# Patient Record
Sex: Female | Born: 1945 | Hispanic: No | Marital: Married | Smoking: Never smoker
Health system: Southern US, Community
[De-identification: ages and names within clinical notes are randomized; demographics above are authoritative.]

## PROBLEM LIST (undated history)

## (undated) DIAGNOSIS — I10 Essential (primary) hypertension: Secondary | ICD-10-CM

## (undated) HISTORY — PX: CHOLECYSTECTOMY: SHX55

---

## 2014-08-10 DIAGNOSIS — J301 Allergic rhinitis due to pollen: Secondary | ICD-10-CM | POA: Diagnosis not present

## 2014-08-10 DIAGNOSIS — J3089 Other allergic rhinitis: Secondary | ICD-10-CM | POA: Diagnosis not present

## 2014-08-10 DIAGNOSIS — S56911D Strain of unspecified muscles, fascia and tendons at forearm level, right arm, subsequent encounter: Secondary | ICD-10-CM | POA: Diagnosis not present

## 2014-08-10 DIAGNOSIS — J3081 Allergic rhinitis due to animal (cat) (dog) hair and dander: Secondary | ICD-10-CM | POA: Diagnosis not present

## 2014-08-17 DIAGNOSIS — J3089 Other allergic rhinitis: Secondary | ICD-10-CM | POA: Diagnosis not present

## 2014-08-17 DIAGNOSIS — J3081 Allergic rhinitis due to animal (cat) (dog) hair and dander: Secondary | ICD-10-CM | POA: Diagnosis not present

## 2014-08-17 DIAGNOSIS — J301 Allergic rhinitis due to pollen: Secondary | ICD-10-CM | POA: Diagnosis not present

## 2014-08-24 DIAGNOSIS — J301 Allergic rhinitis due to pollen: Secondary | ICD-10-CM | POA: Diagnosis not present

## 2014-08-24 DIAGNOSIS — J3089 Other allergic rhinitis: Secondary | ICD-10-CM | POA: Diagnosis not present

## 2014-08-24 DIAGNOSIS — J3081 Allergic rhinitis due to animal (cat) (dog) hair and dander: Secondary | ICD-10-CM | POA: Diagnosis not present

## 2014-08-31 DIAGNOSIS — J301 Allergic rhinitis due to pollen: Secondary | ICD-10-CM | POA: Diagnosis not present

## 2014-08-31 DIAGNOSIS — J3081 Allergic rhinitis due to animal (cat) (dog) hair and dander: Secondary | ICD-10-CM | POA: Diagnosis not present

## 2014-08-31 DIAGNOSIS — J3089 Other allergic rhinitis: Secondary | ICD-10-CM | POA: Diagnosis not present

## 2014-09-07 DIAGNOSIS — J3081 Allergic rhinitis due to animal (cat) (dog) hair and dander: Secondary | ICD-10-CM | POA: Diagnosis not present

## 2014-09-07 DIAGNOSIS — J301 Allergic rhinitis due to pollen: Secondary | ICD-10-CM | POA: Diagnosis not present

## 2014-09-07 DIAGNOSIS — J3089 Other allergic rhinitis: Secondary | ICD-10-CM | POA: Diagnosis not present

## 2014-09-07 DIAGNOSIS — S56911D Strain of unspecified muscles, fascia and tendons at forearm level, right arm, subsequent encounter: Secondary | ICD-10-CM | POA: Diagnosis not present

## 2014-09-15 DIAGNOSIS — J301 Allergic rhinitis due to pollen: Secondary | ICD-10-CM | POA: Diagnosis not present

## 2014-09-15 DIAGNOSIS — J3081 Allergic rhinitis due to animal (cat) (dog) hair and dander: Secondary | ICD-10-CM | POA: Diagnosis not present

## 2014-09-15 DIAGNOSIS — J3089 Other allergic rhinitis: Secondary | ICD-10-CM | POA: Diagnosis not present

## 2014-09-21 DIAGNOSIS — J301 Allergic rhinitis due to pollen: Secondary | ICD-10-CM | POA: Diagnosis not present

## 2014-09-21 DIAGNOSIS — J3089 Other allergic rhinitis: Secondary | ICD-10-CM | POA: Diagnosis not present

## 2014-09-21 DIAGNOSIS — J3081 Allergic rhinitis due to animal (cat) (dog) hair and dander: Secondary | ICD-10-CM | POA: Diagnosis not present

## 2014-09-28 DIAGNOSIS — J301 Allergic rhinitis due to pollen: Secondary | ICD-10-CM | POA: Diagnosis not present

## 2014-09-28 DIAGNOSIS — J3089 Other allergic rhinitis: Secondary | ICD-10-CM | POA: Diagnosis not present

## 2014-09-28 DIAGNOSIS — J3081 Allergic rhinitis due to animal (cat) (dog) hair and dander: Secondary | ICD-10-CM | POA: Diagnosis not present

## 2014-10-05 DIAGNOSIS — J3089 Other allergic rhinitis: Secondary | ICD-10-CM | POA: Diagnosis not present

## 2014-10-05 DIAGNOSIS — J3081 Allergic rhinitis due to animal (cat) (dog) hair and dander: Secondary | ICD-10-CM | POA: Diagnosis not present

## 2014-10-05 DIAGNOSIS — J301 Allergic rhinitis due to pollen: Secondary | ICD-10-CM | POA: Diagnosis not present

## 2014-10-12 DIAGNOSIS — J3089 Other allergic rhinitis: Secondary | ICD-10-CM | POA: Diagnosis not present

## 2014-10-12 DIAGNOSIS — J3081 Allergic rhinitis due to animal (cat) (dog) hair and dander: Secondary | ICD-10-CM | POA: Diagnosis not present

## 2014-10-12 DIAGNOSIS — J301 Allergic rhinitis due to pollen: Secondary | ICD-10-CM | POA: Diagnosis not present

## 2014-10-20 DIAGNOSIS — J301 Allergic rhinitis due to pollen: Secondary | ICD-10-CM | POA: Diagnosis not present

## 2014-10-20 DIAGNOSIS — J3081 Allergic rhinitis due to animal (cat) (dog) hair and dander: Secondary | ICD-10-CM | POA: Diagnosis not present

## 2014-10-20 DIAGNOSIS — J3089 Other allergic rhinitis: Secondary | ICD-10-CM | POA: Diagnosis not present

## 2014-10-26 DIAGNOSIS — J301 Allergic rhinitis due to pollen: Secondary | ICD-10-CM | POA: Diagnosis not present

## 2014-10-26 DIAGNOSIS — J3089 Other allergic rhinitis: Secondary | ICD-10-CM | POA: Diagnosis not present

## 2014-10-26 DIAGNOSIS — J3081 Allergic rhinitis due to animal (cat) (dog) hair and dander: Secondary | ICD-10-CM | POA: Diagnosis not present

## 2014-11-02 DIAGNOSIS — J3081 Allergic rhinitis due to animal (cat) (dog) hair and dander: Secondary | ICD-10-CM | POA: Diagnosis not present

## 2014-11-02 DIAGNOSIS — J301 Allergic rhinitis due to pollen: Secondary | ICD-10-CM | POA: Diagnosis not present

## 2014-11-02 DIAGNOSIS — J3089 Other allergic rhinitis: Secondary | ICD-10-CM | POA: Diagnosis not present

## 2014-11-09 DIAGNOSIS — J3081 Allergic rhinitis due to animal (cat) (dog) hair and dander: Secondary | ICD-10-CM | POA: Diagnosis not present

## 2014-11-09 DIAGNOSIS — J3089 Other allergic rhinitis: Secondary | ICD-10-CM | POA: Diagnosis not present

## 2014-11-09 DIAGNOSIS — J301 Allergic rhinitis due to pollen: Secondary | ICD-10-CM | POA: Diagnosis not present

## 2014-11-16 DIAGNOSIS — J3081 Allergic rhinitis due to animal (cat) (dog) hair and dander: Secondary | ICD-10-CM | POA: Diagnosis not present

## 2014-11-16 DIAGNOSIS — J301 Allergic rhinitis due to pollen: Secondary | ICD-10-CM | POA: Diagnosis not present

## 2014-11-16 DIAGNOSIS — J3089 Other allergic rhinitis: Secondary | ICD-10-CM | POA: Diagnosis not present

## 2014-11-22 DIAGNOSIS — J3089 Other allergic rhinitis: Secondary | ICD-10-CM | POA: Diagnosis not present

## 2014-12-08 DIAGNOSIS — J301 Allergic rhinitis due to pollen: Secondary | ICD-10-CM | POA: Diagnosis not present

## 2014-12-08 DIAGNOSIS — J3081 Allergic rhinitis due to animal (cat) (dog) hair and dander: Secondary | ICD-10-CM | POA: Diagnosis not present

## 2014-12-08 DIAGNOSIS — J3089 Other allergic rhinitis: Secondary | ICD-10-CM | POA: Diagnosis not present

## 2014-12-14 DIAGNOSIS — J3089 Other allergic rhinitis: Secondary | ICD-10-CM | POA: Diagnosis not present

## 2014-12-14 DIAGNOSIS — J301 Allergic rhinitis due to pollen: Secondary | ICD-10-CM | POA: Diagnosis not present

## 2014-12-29 DIAGNOSIS — J3081 Allergic rhinitis due to animal (cat) (dog) hair and dander: Secondary | ICD-10-CM | POA: Diagnosis not present

## 2014-12-29 DIAGNOSIS — J3089 Other allergic rhinitis: Secondary | ICD-10-CM | POA: Diagnosis not present

## 2014-12-29 DIAGNOSIS — J301 Allergic rhinitis due to pollen: Secondary | ICD-10-CM | POA: Diagnosis not present

## 2015-01-05 DIAGNOSIS — J3089 Other allergic rhinitis: Secondary | ICD-10-CM | POA: Diagnosis not present

## 2015-01-05 DIAGNOSIS — J3081 Allergic rhinitis due to animal (cat) (dog) hair and dander: Secondary | ICD-10-CM | POA: Diagnosis not present

## 2015-01-05 DIAGNOSIS — J301 Allergic rhinitis due to pollen: Secondary | ICD-10-CM | POA: Diagnosis not present

## 2015-01-08 DIAGNOSIS — J301 Allergic rhinitis due to pollen: Secondary | ICD-10-CM | POA: Diagnosis not present

## 2015-01-08 DIAGNOSIS — J3081 Allergic rhinitis due to animal (cat) (dog) hair and dander: Secondary | ICD-10-CM | POA: Diagnosis not present

## 2015-01-11 DIAGNOSIS — J3081 Allergic rhinitis due to animal (cat) (dog) hair and dander: Secondary | ICD-10-CM | POA: Diagnosis not present

## 2015-01-11 DIAGNOSIS — J301 Allergic rhinitis due to pollen: Secondary | ICD-10-CM | POA: Diagnosis not present

## 2015-01-11 DIAGNOSIS — J3089 Other allergic rhinitis: Secondary | ICD-10-CM | POA: Diagnosis not present

## 2015-01-18 DIAGNOSIS — J3089 Other allergic rhinitis: Secondary | ICD-10-CM | POA: Diagnosis not present

## 2015-01-18 DIAGNOSIS — J301 Allergic rhinitis due to pollen: Secondary | ICD-10-CM | POA: Diagnosis not present

## 2015-01-25 DIAGNOSIS — E559 Vitamin D deficiency, unspecified: Secondary | ICD-10-CM | POA: Diagnosis not present

## 2015-01-25 DIAGNOSIS — J3089 Other allergic rhinitis: Secondary | ICD-10-CM | POA: Diagnosis not present

## 2015-01-25 DIAGNOSIS — I10 Essential (primary) hypertension: Secondary | ICD-10-CM | POA: Diagnosis not present

## 2015-01-25 DIAGNOSIS — E2839 Other primary ovarian failure: Secondary | ICD-10-CM | POA: Diagnosis not present

## 2015-01-25 DIAGNOSIS — E780 Pure hypercholesterolemia: Secondary | ICD-10-CM | POA: Diagnosis not present

## 2015-01-25 DIAGNOSIS — J301 Allergic rhinitis due to pollen: Secondary | ICD-10-CM | POA: Diagnosis not present

## 2015-01-25 DIAGNOSIS — E1122 Type 2 diabetes mellitus with diabetic chronic kidney disease: Secondary | ICD-10-CM | POA: Diagnosis not present

## 2015-01-25 DIAGNOSIS — N182 Chronic kidney disease, stage 2 (mild): Secondary | ICD-10-CM | POA: Diagnosis not present

## 2015-01-25 DIAGNOSIS — R922 Inconclusive mammogram: Secondary | ICD-10-CM | POA: Diagnosis not present

## 2015-01-26 ENCOUNTER — Other Ambulatory Visit: Payer: Self-pay | Admitting: Family Medicine

## 2015-01-26 DIAGNOSIS — R922 Inconclusive mammogram: Secondary | ICD-10-CM

## 2015-02-01 DIAGNOSIS — J301 Allergic rhinitis due to pollen: Secondary | ICD-10-CM | POA: Diagnosis not present

## 2015-02-01 DIAGNOSIS — J3089 Other allergic rhinitis: Secondary | ICD-10-CM | POA: Diagnosis not present

## 2015-02-08 DIAGNOSIS — J301 Allergic rhinitis due to pollen: Secondary | ICD-10-CM | POA: Diagnosis not present

## 2015-02-08 DIAGNOSIS — J3081 Allergic rhinitis due to animal (cat) (dog) hair and dander: Secondary | ICD-10-CM | POA: Diagnosis not present

## 2015-02-08 DIAGNOSIS — J3089 Other allergic rhinitis: Secondary | ICD-10-CM | POA: Diagnosis not present

## 2015-02-15 DIAGNOSIS — J301 Allergic rhinitis due to pollen: Secondary | ICD-10-CM | POA: Diagnosis not present

## 2015-02-15 DIAGNOSIS — J3089 Other allergic rhinitis: Secondary | ICD-10-CM | POA: Diagnosis not present

## 2015-02-22 DIAGNOSIS — J301 Allergic rhinitis due to pollen: Secondary | ICD-10-CM | POA: Diagnosis not present

## 2015-02-22 DIAGNOSIS — J3089 Other allergic rhinitis: Secondary | ICD-10-CM | POA: Diagnosis not present

## 2015-03-01 DIAGNOSIS — J3089 Other allergic rhinitis: Secondary | ICD-10-CM | POA: Diagnosis not present

## 2015-03-01 DIAGNOSIS — J301 Allergic rhinitis due to pollen: Secondary | ICD-10-CM | POA: Diagnosis not present

## 2015-03-08 DIAGNOSIS — J3089 Other allergic rhinitis: Secondary | ICD-10-CM | POA: Diagnosis not present

## 2015-03-08 DIAGNOSIS — J301 Allergic rhinitis due to pollen: Secondary | ICD-10-CM | POA: Diagnosis not present

## 2015-03-08 DIAGNOSIS — J3081 Allergic rhinitis due to animal (cat) (dog) hair and dander: Secondary | ICD-10-CM | POA: Diagnosis not present

## 2015-03-15 DIAGNOSIS — J3081 Allergic rhinitis due to animal (cat) (dog) hair and dander: Secondary | ICD-10-CM | POA: Diagnosis not present

## 2015-03-15 DIAGNOSIS — J301 Allergic rhinitis due to pollen: Secondary | ICD-10-CM | POA: Diagnosis not present

## 2015-03-15 DIAGNOSIS — J3089 Other allergic rhinitis: Secondary | ICD-10-CM | POA: Diagnosis not present

## 2015-03-22 DIAGNOSIS — J3089 Other allergic rhinitis: Secondary | ICD-10-CM | POA: Diagnosis not present

## 2015-03-22 DIAGNOSIS — J301 Allergic rhinitis due to pollen: Secondary | ICD-10-CM | POA: Diagnosis not present

## 2015-03-29 DIAGNOSIS — J3089 Other allergic rhinitis: Secondary | ICD-10-CM | POA: Diagnosis not present

## 2015-03-29 DIAGNOSIS — J3081 Allergic rhinitis due to animal (cat) (dog) hair and dander: Secondary | ICD-10-CM | POA: Diagnosis not present

## 2015-03-29 DIAGNOSIS — J301 Allergic rhinitis due to pollen: Secondary | ICD-10-CM | POA: Diagnosis not present

## 2015-04-05 DIAGNOSIS — J3081 Allergic rhinitis due to animal (cat) (dog) hair and dander: Secondary | ICD-10-CM | POA: Diagnosis not present

## 2015-04-05 DIAGNOSIS — J3089 Other allergic rhinitis: Secondary | ICD-10-CM | POA: Diagnosis not present

## 2015-04-05 DIAGNOSIS — J301 Allergic rhinitis due to pollen: Secondary | ICD-10-CM | POA: Diagnosis not present

## 2015-04-12 DIAGNOSIS — J301 Allergic rhinitis due to pollen: Secondary | ICD-10-CM | POA: Diagnosis not present

## 2015-04-12 DIAGNOSIS — J3081 Allergic rhinitis due to animal (cat) (dog) hair and dander: Secondary | ICD-10-CM | POA: Diagnosis not present

## 2015-04-12 DIAGNOSIS — J3089 Other allergic rhinitis: Secondary | ICD-10-CM | POA: Diagnosis not present

## 2015-04-19 DIAGNOSIS — J3089 Other allergic rhinitis: Secondary | ICD-10-CM | POA: Diagnosis not present

## 2015-04-19 DIAGNOSIS — J3081 Allergic rhinitis due to animal (cat) (dog) hair and dander: Secondary | ICD-10-CM | POA: Diagnosis not present

## 2015-04-19 DIAGNOSIS — J301 Allergic rhinitis due to pollen: Secondary | ICD-10-CM | POA: Diagnosis not present

## 2015-04-27 DIAGNOSIS — J301 Allergic rhinitis due to pollen: Secondary | ICD-10-CM | POA: Diagnosis not present

## 2015-04-27 DIAGNOSIS — J3089 Other allergic rhinitis: Secondary | ICD-10-CM | POA: Diagnosis not present

## 2015-04-27 DIAGNOSIS — J3081 Allergic rhinitis due to animal (cat) (dog) hair and dander: Secondary | ICD-10-CM | POA: Diagnosis not present

## 2015-05-03 DIAGNOSIS — J301 Allergic rhinitis due to pollen: Secondary | ICD-10-CM | POA: Diagnosis not present

## 2015-05-03 DIAGNOSIS — J3089 Other allergic rhinitis: Secondary | ICD-10-CM | POA: Diagnosis not present

## 2015-05-03 DIAGNOSIS — J3081 Allergic rhinitis due to animal (cat) (dog) hair and dander: Secondary | ICD-10-CM | POA: Diagnosis not present

## 2015-05-10 DIAGNOSIS — J3089 Other allergic rhinitis: Secondary | ICD-10-CM | POA: Diagnosis not present

## 2015-05-10 DIAGNOSIS — J3081 Allergic rhinitis due to animal (cat) (dog) hair and dander: Secondary | ICD-10-CM | POA: Diagnosis not present

## 2015-05-10 DIAGNOSIS — J301 Allergic rhinitis due to pollen: Secondary | ICD-10-CM | POA: Diagnosis not present

## 2015-05-18 DIAGNOSIS — J301 Allergic rhinitis due to pollen: Secondary | ICD-10-CM | POA: Diagnosis not present

## 2015-05-18 DIAGNOSIS — J3081 Allergic rhinitis due to animal (cat) (dog) hair and dander: Secondary | ICD-10-CM | POA: Diagnosis not present

## 2015-05-18 DIAGNOSIS — J3089 Other allergic rhinitis: Secondary | ICD-10-CM | POA: Diagnosis not present

## 2015-05-24 DIAGNOSIS — J3089 Other allergic rhinitis: Secondary | ICD-10-CM | POA: Diagnosis not present

## 2015-05-24 DIAGNOSIS — J301 Allergic rhinitis due to pollen: Secondary | ICD-10-CM | POA: Diagnosis not present

## 2015-05-24 DIAGNOSIS — J3081 Allergic rhinitis due to animal (cat) (dog) hair and dander: Secondary | ICD-10-CM | POA: Diagnosis not present

## 2015-05-25 DIAGNOSIS — J3089 Other allergic rhinitis: Secondary | ICD-10-CM | POA: Diagnosis not present

## 2015-05-28 DIAGNOSIS — R05 Cough: Secondary | ICD-10-CM | POA: Diagnosis not present

## 2015-05-31 DIAGNOSIS — J3089 Other allergic rhinitis: Secondary | ICD-10-CM | POA: Diagnosis not present

## 2015-05-31 DIAGNOSIS — J301 Allergic rhinitis due to pollen: Secondary | ICD-10-CM | POA: Diagnosis not present

## 2015-05-31 DIAGNOSIS — J3081 Allergic rhinitis due to animal (cat) (dog) hair and dander: Secondary | ICD-10-CM | POA: Diagnosis not present

## 2015-06-08 DIAGNOSIS — J3081 Allergic rhinitis due to animal (cat) (dog) hair and dander: Secondary | ICD-10-CM | POA: Diagnosis not present

## 2015-06-08 DIAGNOSIS — J3089 Other allergic rhinitis: Secondary | ICD-10-CM | POA: Diagnosis not present

## 2015-06-08 DIAGNOSIS — J301 Allergic rhinitis due to pollen: Secondary | ICD-10-CM | POA: Diagnosis not present

## 2015-06-12 DIAGNOSIS — Z0000 Encounter for general adult medical examination without abnormal findings: Secondary | ICD-10-CM | POA: Diagnosis not present

## 2015-06-12 DIAGNOSIS — H532 Diplopia: Secondary | ICD-10-CM | POA: Diagnosis not present

## 2015-06-12 DIAGNOSIS — Z1159 Encounter for screening for other viral diseases: Secondary | ICD-10-CM | POA: Diagnosis not present

## 2015-06-14 DIAGNOSIS — I483 Typical atrial flutter: Secondary | ICD-10-CM | POA: Diagnosis not present

## 2015-06-14 DIAGNOSIS — Z0000 Encounter for general adult medical examination without abnormal findings: Secondary | ICD-10-CM | POA: Diagnosis not present

## 2015-06-14 DIAGNOSIS — Z1231 Encounter for screening mammogram for malignant neoplasm of breast: Secondary | ICD-10-CM | POA: Diagnosis not present

## 2015-06-14 DIAGNOSIS — J3081 Allergic rhinitis due to animal (cat) (dog) hair and dander: Secondary | ICD-10-CM | POA: Diagnosis not present

## 2015-06-14 DIAGNOSIS — J3089 Other allergic rhinitis: Secondary | ICD-10-CM | POA: Diagnosis not present

## 2015-06-14 DIAGNOSIS — Z23 Encounter for immunization: Secondary | ICD-10-CM | POA: Diagnosis not present

## 2015-06-14 DIAGNOSIS — J42 Unspecified chronic bronchitis: Secondary | ICD-10-CM | POA: Diagnosis not present

## 2015-06-14 DIAGNOSIS — I700 Atherosclerosis of aorta: Secondary | ICD-10-CM | POA: Diagnosis not present

## 2015-06-14 DIAGNOSIS — E039 Hypothyroidism, unspecified: Secondary | ICD-10-CM | POA: Diagnosis not present

## 2015-06-14 DIAGNOSIS — M25522 Pain in left elbow: Secondary | ICD-10-CM | POA: Diagnosis not present

## 2015-06-14 DIAGNOSIS — M25622 Stiffness of left elbow, not elsewhere classified: Secondary | ICD-10-CM | POA: Diagnosis not present

## 2015-06-14 DIAGNOSIS — E1122 Type 2 diabetes mellitus with diabetic chronic kidney disease: Secondary | ICD-10-CM | POA: Diagnosis not present

## 2015-06-14 DIAGNOSIS — J301 Allergic rhinitis due to pollen: Secondary | ICD-10-CM | POA: Diagnosis not present

## 2015-06-22 DIAGNOSIS — J301 Allergic rhinitis due to pollen: Secondary | ICD-10-CM | POA: Diagnosis not present

## 2015-06-22 DIAGNOSIS — J3089 Other allergic rhinitis: Secondary | ICD-10-CM | POA: Diagnosis not present

## 2015-06-22 DIAGNOSIS — J3081 Allergic rhinitis due to animal (cat) (dog) hair and dander: Secondary | ICD-10-CM | POA: Diagnosis not present

## 2015-06-22 DIAGNOSIS — Z0000 Encounter for general adult medical examination without abnormal findings: Secondary | ICD-10-CM | POA: Diagnosis not present

## 2015-06-27 DIAGNOSIS — J301 Allergic rhinitis due to pollen: Secondary | ICD-10-CM | POA: Diagnosis not present

## 2015-06-27 DIAGNOSIS — J3089 Other allergic rhinitis: Secondary | ICD-10-CM | POA: Diagnosis not present

## 2015-06-27 DIAGNOSIS — J3081 Allergic rhinitis due to animal (cat) (dog) hair and dander: Secondary | ICD-10-CM | POA: Diagnosis not present

## 2015-07-03 DIAGNOSIS — J3081 Allergic rhinitis due to animal (cat) (dog) hair and dander: Secondary | ICD-10-CM | POA: Diagnosis not present

## 2015-07-03 DIAGNOSIS — J301 Allergic rhinitis due to pollen: Secondary | ICD-10-CM | POA: Diagnosis not present

## 2015-07-04 DIAGNOSIS — J301 Allergic rhinitis due to pollen: Secondary | ICD-10-CM | POA: Diagnosis not present

## 2015-07-04 DIAGNOSIS — J3081 Allergic rhinitis due to animal (cat) (dog) hair and dander: Secondary | ICD-10-CM | POA: Diagnosis not present

## 2015-07-04 DIAGNOSIS — J3089 Other allergic rhinitis: Secondary | ICD-10-CM | POA: Diagnosis not present

## 2015-07-11 DIAGNOSIS — J3081 Allergic rhinitis due to animal (cat) (dog) hair and dander: Secondary | ICD-10-CM | POA: Diagnosis not present

## 2015-07-11 DIAGNOSIS — J301 Allergic rhinitis due to pollen: Secondary | ICD-10-CM | POA: Diagnosis not present

## 2015-07-17 DIAGNOSIS — J3089 Other allergic rhinitis: Secondary | ICD-10-CM | POA: Diagnosis not present

## 2015-07-17 DIAGNOSIS — J3081 Allergic rhinitis due to animal (cat) (dog) hair and dander: Secondary | ICD-10-CM | POA: Diagnosis not present

## 2015-07-17 DIAGNOSIS — J301 Allergic rhinitis due to pollen: Secondary | ICD-10-CM | POA: Diagnosis not present

## 2015-07-23 ENCOUNTER — Emergency Department (HOSPITAL_COMMUNITY)
Admission: EM | Admit: 2015-07-23 | Discharge: 2015-07-23 | Disposition: A | Discharge status: Home / Self Care | Payer: Commercial Managed Care - HMO | Attending: Emergency Medicine | Admitting: Emergency Medicine

## 2015-07-23 ENCOUNTER — Encounter (HOSPITAL_COMMUNITY): Payer: Self-pay | Admitting: Emergency Medicine

## 2015-07-23 DIAGNOSIS — Y9389 Activity, other specified: Secondary | ICD-10-CM | POA: Diagnosis not present

## 2015-07-23 DIAGNOSIS — Y998 Other external cause status: Secondary | ICD-10-CM | POA: Insufficient documentation

## 2015-07-23 DIAGNOSIS — S61219A Laceration without foreign body of unspecified finger without damage to nail, initial encounter: Secondary | ICD-10-CM

## 2015-07-23 DIAGNOSIS — W268XXA Contact with other sharp object(s), not elsewhere classified, initial encounter: Secondary | ICD-10-CM | POA: Diagnosis not present

## 2015-07-23 DIAGNOSIS — S61215A Laceration without foreign body of left ring finger without damage to nail, initial encounter: Secondary | ICD-10-CM | POA: Insufficient documentation

## 2015-07-23 DIAGNOSIS — Y92009 Unspecified place in unspecified non-institutional (private) residence as the place of occurrence of the external cause: Secondary | ICD-10-CM | POA: Insufficient documentation

## 2015-07-23 DIAGNOSIS — I10 Essential (primary) hypertension: Secondary | ICD-10-CM | POA: Diagnosis not present

## 2015-07-23 DIAGNOSIS — S6992XA Unspecified injury of left wrist, hand and finger(s), initial encounter: Secondary | ICD-10-CM | POA: Diagnosis present

## 2015-07-23 HISTORY — DX: Essential (primary) hypertension: I10

## 2015-07-23 MED ORDER — TETANUS-DIPHTH-ACELL PERTUSSIS 5-2.5-18.5 LF-MCG/0.5 IM SUSP
0.5000 mL | Freq: Once | INTRAMUSCULAR | Status: AC
  Administered 2015-07-23: 0.5 mL via INTRAMUSCULAR
  Filled 2015-07-23: qty 0.5

## 2015-07-23 NOTE — ED Notes (Addendum)
Pt. presents with small laceration at right distal 4th finger sustained from a blade of a food processor this evening at home with moderate bleeding .

## 2015-07-23 NOTE — Discharge Instructions (Signed)
Stitches, Staples, or Adhesive Wound Closure  Doctors use stitches (sutures), staples, and certain glue (skin adhesives) to hold your skin together while it heals (wound closure). You may need this treatment after you have surgery or if you cut your skin accidentally. These methods help your skin heal more quickly. They also make it less likely that you will have a scar.  WHAT ARE THE DIFFERENT KINDS OF WOUND CLOSURES?  There are many options for wound closure. The one that your doctor uses depends on how deep and large your wound is.  Adhesive Glue  To use this glue to close a wound, your doctor holds the edges of the wound together and paints the glue on the surface of your skin. You may need more than one layer of glue. Then the wound may be covered with a light bandage (dressing).  This type of skin closure may be used for small wounds that are not deep (superficial). Using glue for wound closure is less painful than other methods. It does not require a medicine that numbs the area. This method also leaves nothing to be removed. Adhesive glue is often used for children and on facial wounds.  Adhesive glue cannot be used for wounds that are deep, uneven, or bleeding. It is not used inside of a wound.   Adhesive Strips  These strips are made of sticky (adhesive), porous paper. They are placed across your skin edges like a regular adhesive bandage. You leave them on until they fall off.  Adhesive strips may be used to close very superficial wounds. They may also be used along with sutures to improve closure of your skin edges.   Sutures  Sutures are the oldest method of wound closure. Sutures can be made from natural or synthetic materials. They can be made from a material that your body can break down as your wound heals (absorbable), or they can be made from a material that needs to be removed from your skin (nonabsorbable). They come in many different strengths and sizes.  Your doctor attaches the sutures to a  steel needle on one end. Sutures can be passed through your skin, or through the tissues beneath your skin. Then they are tied and cut. Your skin edges may be closed in one continuous stitch or in separate stitches.  Sutures are strong and can be used for all kinds of wounds. Absorbable sutures may be used to close tissues under the skin. The disadvantage of sutures is that they may cause skin reactions that lead to infection. Nonabsorbable sutures need to be removed.  Staples  When surgical staples are used to close a wound, the edges of your skin on both sides of the wound are brought close together. A staple is placed across the wound, and an instrument secures the edges together. Staples are often used to close surgical cuts (incisions).  Staples are faster to use than sutures, and they cause less reaction from your skin. Staples need to be removed using a tool that bends the staples away from your skin.  HOW DO I CARE FOR MY WOUND CLOSURE?  · Take medicines only as told by your doctor.  · If you were prescribed an antibiotic medicine for your wound, finish it all even if you start to feel better.  · Use ointments or creams only as told by your doctor.  · Wash your hands with soap and water before and after touching your wound.  · Do not soak your wound in   water. Do not take baths, swim, or use a hot tub until your doctor says it is okay.  · Ask your doctor when you can start showering. Cover your wound if told by your doctor.  · Do not take out your own sutures or staples.  · Do not pick at your wound. Picking can cause an infection.  · Keep all follow-up visits as told by your doctor. This is important.  HOW LONG WILL I HAVE MY WOUND CLOSURE?   · Leave adhesive glue on your skin until the glue peels away.  · Leave adhesive strips on your skin until they fall off.  · Absorbable sutures will dissolve within several days.  · Nonabsorbable sutures and staples must be removed. The location of the wound will  determine how long they stay in. This can range from several days to a couple of weeks.  WHEN SHOULD I SEEK HELP FOR MY WOUND CLOSURE?  Contact your doctor if:  · You have a fever.  · You have chills.  · You have redness, puffiness (swelling), or pain at the site of your wound.  · You have fluid, blood, or pus coming from your wound.  · There is a bad smell coming from your wound.  · The skin edges of your wound start to separate after your sutures have been removed.  · Your wound becomes thick, raised, and darker in color after your sutures come out (scarring).     This information is not intended to replace advice given to you by your health care provider. Make sure you discuss any questions you have with your health care provider.     Document Released: 05/18/2009 Document Revised: 08/11/2014 Document Reviewed: 12/28/2013  [employer]vier Interactive Patient Education ©2016 [employer]

## 2015-07-23 NOTE — ED Notes (Signed)
See EDP assessment 

## 2015-07-23 NOTE — ED Provider Notes (Signed)
CSN: [id number]     Arrival date & time 07/23/15  2035 History  By signing my name below, I, Soijett Blue, attest that this documentation has been prepared under the direction and in the presence of Teressa LowerVrinda Brennen Gardiner, NP Electronically Signed: Soijett Blue, ED Scribe. 07/23/2015. 8:59 PM.   Chief Complaint  Patient presents with  . Laceration      The history is provided by the patient. No language interpreter was used.    Kristina Mcknight is a 69 y.o. female who presents to the Emergency Department complaining of left ring finger laceration onset PTA. She reports that she cut her left ring finger on the blade from a food processor and has had moderate bleeding since. She is unsure of the status of her tetanus vaccination. She states that she has tried applying pressure to the area for the relief for her symptoms. She denies color change, swelling, and any other symptoms. Denies blood thinners at this time. Denies allergies to medications.    Past Medical History  Diagnosis Date  . Hypertension    Past Surgical History  Procedure Laterality Date  . Cholecystectomy     No family history on file. Social History  Substance Use Topics  . Smoking status: Never Smoker   . Smokeless tobacco: None  . Alcohol Use: Yes   OB History    No data available     Review of Systems  Musculoskeletal: Positive for myalgias. Negative for joint swelling.  Skin: Positive for wound. Negative for color change.  All other systems reviewed and are negative.     Allergies  Review of patient's allergies indicates no known allergies.  Home Medications   Prior to Admission medications   Not on File   BP 177/96 mmHg  Pulse 89  Temp(Src) 98.1 F (36.7 C) (Oral)  Resp 16  SpO2 98% Physical Exam  Constitutional: She is oriented to person, place, and time. She appears well-developed and well-nourished. No distress.  HENT:  Head: Normocephalic and atraumatic.  Eyes: EOM are normal.  Neck: Neck  supple.  Cardiovascular: Normal rate.   Pulmonary/Chest: Effort normal. No respiratory distress.  Musculoskeletal: Normal range of motion.  Neurological: She is alert and oriented to person, place, and time.  Skin: Skin is warm and dry. Laceration noted.  0.5 cm superficial laceration to the pad of the 4th finger. NVI.   Psychiatric: She has a normal mood and affect. Her behavior is normal.  Nursing note and vitals reviewed.   ED Course  Procedures (including critical care time) DIAGNOSTIC STUDIES: Oxygen Saturation is 98% on RA, nl by my interpretation.    COORDINATION OF CARE: 8:58 PM Discussed treatment plan with pt at bedside which includes wound care with derma-bond and tetanus updated and pt agreed to plan.  LACERATION REPAIR PROCEDURE NOTE The patient's identification was confirmed and consent was obtained. This procedure was performed by Teressa LowerVrinda Shaughn Thomley, NP at 9:00 PM. Site: Pad of Left 4th finger Sterile procedures observed: YES Anesthetic used (type and amt): N/A; Derma-Bond used Suture type/size:N/A Length: 0.5 cm # of Sutures: N/A Technique:Single interrupted Complexity: SImple Antibx ointment applied: Bacitracin  Tetanus UTD or ordered: Yes Wound well approximated, site covered with dry, sterile dressing.  Patient tolerated procedure well without complications. Instructions for care discussed verbally and patient provided with additional written instructions for homecare and f/u.   Labs Review Labs Reviewed - No data to display  Imaging Review No results found.    EKG Interpretation None  MDM   Final diagnoses:  Finger laceration, initial encounter   Wound closed without any problem. Tetanus updated. Discussed return precautions  I personally performed the services described in this documentation, which was scribed in my presence. The recorded information has been reviewed and is accurate.    Teressa Lower, NP 07/23/15 [id number]  Cathren Laine, MD 07/24/15 [phone]

## 2015-07-25 DIAGNOSIS — J3081 Allergic rhinitis due to animal (cat) (dog) hair and dander: Secondary | ICD-10-CM | POA: Diagnosis not present

## 2015-07-25 DIAGNOSIS — J301 Allergic rhinitis due to pollen: Secondary | ICD-10-CM | POA: Diagnosis not present

## 2015-07-25 DIAGNOSIS — J3089 Other allergic rhinitis: Secondary | ICD-10-CM | POA: Diagnosis not present

## 2015-07-28 DIAGNOSIS — K05219 Aggressive periodontitis, localized, unspecified severity: Secondary | ICD-10-CM | POA: Diagnosis not present

## 2015-08-09 DIAGNOSIS — Z0000 Encounter for general adult medical examination without abnormal findings: Secondary | ICD-10-CM | POA: Diagnosis not present

## 2015-08-09 DIAGNOSIS — I10 Essential (primary) hypertension: Secondary | ICD-10-CM | POA: Diagnosis not present

## 2015-08-09 DIAGNOSIS — R0989 Other specified symptoms and signs involving the circulatory and respiratory systems: Secondary | ICD-10-CM | POA: Diagnosis not present

## 2015-08-09 DIAGNOSIS — J3081 Allergic rhinitis due to animal (cat) (dog) hair and dander: Secondary | ICD-10-CM | POA: Diagnosis not present

## 2015-08-09 DIAGNOSIS — Z79899 Other long term (current) drug therapy: Secondary | ICD-10-CM | POA: Diagnosis not present

## 2015-08-09 DIAGNOSIS — J3089 Other allergic rhinitis: Secondary | ICD-10-CM | POA: Diagnosis not present

## 2015-08-09 DIAGNOSIS — E559 Vitamin D deficiency, unspecified: Secondary | ICD-10-CM | POA: Diagnosis not present

## 2015-08-09 DIAGNOSIS — J301 Allergic rhinitis due to pollen: Secondary | ICD-10-CM | POA: Diagnosis not present

## 2015-08-09 DIAGNOSIS — Z23 Encounter for immunization: Secondary | ICD-10-CM | POA: Diagnosis not present

## 2015-08-09 DIAGNOSIS — E7800 Pure hypercholesterolemia, unspecified: Secondary | ICD-10-CM | POA: Diagnosis not present

## 2015-08-09 DIAGNOSIS — E1122 Type 2 diabetes mellitus with diabetic chronic kidney disease: Secondary | ICD-10-CM | POA: Diagnosis not present

## 2015-08-10 ENCOUNTER — Other Ambulatory Visit: Payer: Self-pay | Admitting: Family Medicine

## 2015-08-10 DIAGNOSIS — R0989 Other specified symptoms and signs involving the circulatory and respiratory systems: Secondary | ICD-10-CM

## 2015-08-15 ENCOUNTER — Ambulatory Visit
Admission: RE | Admit: 2015-08-15 | Discharge: 2015-08-15 | Disposition: A | Discharge status: Home / Self Care | Payer: Commercial Managed Care - HMO | Source: Ambulatory Visit | Attending: Family Medicine | Admitting: Family Medicine

## 2015-08-15 DIAGNOSIS — R0989 Other specified symptoms and signs involving the circulatory and respiratory systems: Secondary | ICD-10-CM

## 2015-08-15 DIAGNOSIS — I6523 Occlusion and stenosis of bilateral carotid arteries: Secondary | ICD-10-CM | POA: Diagnosis not present

## 2015-08-16 DIAGNOSIS — J3089 Other allergic rhinitis: Secondary | ICD-10-CM | POA: Diagnosis not present

## 2015-08-16 DIAGNOSIS — J301 Allergic rhinitis due to pollen: Secondary | ICD-10-CM | POA: Diagnosis not present

## 2015-08-16 DIAGNOSIS — J3081 Allergic rhinitis due to animal (cat) (dog) hair and dander: Secondary | ICD-10-CM | POA: Diagnosis not present

## 2015-08-17 DIAGNOSIS — Z1211 Encounter for screening for malignant neoplasm of colon: Secondary | ICD-10-CM | POA: Diagnosis not present

## 2015-08-17 DIAGNOSIS — Z0000 Encounter for general adult medical examination without abnormal findings: Secondary | ICD-10-CM | POA: Diagnosis not present

## 2015-08-23 DIAGNOSIS — J3081 Allergic rhinitis due to animal (cat) (dog) hair and dander: Secondary | ICD-10-CM | POA: Diagnosis not present

## 2015-08-23 DIAGNOSIS — J3089 Other allergic rhinitis: Secondary | ICD-10-CM | POA: Diagnosis not present

## 2015-08-23 DIAGNOSIS — J301 Allergic rhinitis due to pollen: Secondary | ICD-10-CM | POA: Diagnosis not present

## 2015-08-30 DIAGNOSIS — J301 Allergic rhinitis due to pollen: Secondary | ICD-10-CM | POA: Diagnosis not present

## 2015-08-30 DIAGNOSIS — J3089 Other allergic rhinitis: Secondary | ICD-10-CM | POA: Diagnosis not present

## 2015-08-30 DIAGNOSIS — J3081 Allergic rhinitis due to animal (cat) (dog) hair and dander: Secondary | ICD-10-CM | POA: Diagnosis not present

## 2015-09-13 DIAGNOSIS — J3081 Allergic rhinitis due to animal (cat) (dog) hair and dander: Secondary | ICD-10-CM | POA: Diagnosis not present

## 2015-09-13 DIAGNOSIS — J301 Allergic rhinitis due to pollen: Secondary | ICD-10-CM | POA: Diagnosis not present

## 2015-09-13 DIAGNOSIS — J3089 Other allergic rhinitis: Secondary | ICD-10-CM | POA: Diagnosis not present

## 2015-09-20 DIAGNOSIS — J3081 Allergic rhinitis due to animal (cat) (dog) hair and dander: Secondary | ICD-10-CM | POA: Diagnosis not present

## 2015-09-20 DIAGNOSIS — J301 Allergic rhinitis due to pollen: Secondary | ICD-10-CM | POA: Diagnosis not present

## 2015-09-20 DIAGNOSIS — J3089 Other allergic rhinitis: Secondary | ICD-10-CM | POA: Diagnosis not present

## 2015-09-27 DIAGNOSIS — J301 Allergic rhinitis due to pollen: Secondary | ICD-10-CM | POA: Diagnosis not present

## 2015-09-27 DIAGNOSIS — J3081 Allergic rhinitis due to animal (cat) (dog) hair and dander: Secondary | ICD-10-CM | POA: Diagnosis not present

## 2015-09-27 DIAGNOSIS — J3089 Other allergic rhinitis: Secondary | ICD-10-CM | POA: Diagnosis not present

## 2015-10-04 DIAGNOSIS — J301 Allergic rhinitis due to pollen: Secondary | ICD-10-CM | POA: Diagnosis not present

## 2015-10-04 DIAGNOSIS — J3081 Allergic rhinitis due to animal (cat) (dog) hair and dander: Secondary | ICD-10-CM | POA: Diagnosis not present

## 2015-10-04 DIAGNOSIS — J3089 Other allergic rhinitis: Secondary | ICD-10-CM | POA: Diagnosis not present

## 2015-10-11 DIAGNOSIS — J3089 Other allergic rhinitis: Secondary | ICD-10-CM | POA: Diagnosis not present

## 2015-10-11 DIAGNOSIS — J3081 Allergic rhinitis due to animal (cat) (dog) hair and dander: Secondary | ICD-10-CM | POA: Diagnosis not present

## 2015-10-11 DIAGNOSIS — J301 Allergic rhinitis due to pollen: Secondary | ICD-10-CM | POA: Diagnosis not present

## 2015-10-25 DIAGNOSIS — J3089 Other allergic rhinitis: Secondary | ICD-10-CM | POA: Diagnosis not present

## 2015-10-25 DIAGNOSIS — J301 Allergic rhinitis due to pollen: Secondary | ICD-10-CM | POA: Diagnosis not present

## 2015-10-25 DIAGNOSIS — J3081 Allergic rhinitis due to animal (cat) (dog) hair and dander: Secondary | ICD-10-CM | POA: Diagnosis not present

## 2015-11-01 DIAGNOSIS — J3089 Other allergic rhinitis: Secondary | ICD-10-CM | POA: Diagnosis not present

## 2015-11-01 DIAGNOSIS — J3081 Allergic rhinitis due to animal (cat) (dog) hair and dander: Secondary | ICD-10-CM | POA: Diagnosis not present

## 2015-11-01 DIAGNOSIS — J301 Allergic rhinitis due to pollen: Secondary | ICD-10-CM | POA: Diagnosis not present

## 2015-11-07 DIAGNOSIS — J3081 Allergic rhinitis due to animal (cat) (dog) hair and dander: Secondary | ICD-10-CM | POA: Diagnosis not present

## 2015-11-07 DIAGNOSIS — J301 Allergic rhinitis due to pollen: Secondary | ICD-10-CM | POA: Diagnosis not present

## 2015-11-07 DIAGNOSIS — J3089 Other allergic rhinitis: Secondary | ICD-10-CM | POA: Diagnosis not present

## 2015-11-15 DIAGNOSIS — J301 Allergic rhinitis due to pollen: Secondary | ICD-10-CM | POA: Diagnosis not present

## 2015-11-15 DIAGNOSIS — J3081 Allergic rhinitis due to animal (cat) (dog) hair and dander: Secondary | ICD-10-CM | POA: Diagnosis not present

## 2015-11-15 DIAGNOSIS — J3089 Other allergic rhinitis: Secondary | ICD-10-CM | POA: Diagnosis not present

## 2015-11-29 DIAGNOSIS — J3081 Allergic rhinitis due to animal (cat) (dog) hair and dander: Secondary | ICD-10-CM | POA: Diagnosis not present

## 2015-11-29 DIAGNOSIS — E7800 Pure hypercholesterolemia, unspecified: Secondary | ICD-10-CM | POA: Diagnosis not present

## 2015-11-29 DIAGNOSIS — E1122 Type 2 diabetes mellitus with diabetic chronic kidney disease: Secondary | ICD-10-CM | POA: Diagnosis not present

## 2015-11-29 DIAGNOSIS — J301 Allergic rhinitis due to pollen: Secondary | ICD-10-CM | POA: Diagnosis not present

## 2015-11-29 DIAGNOSIS — J3089 Other allergic rhinitis: Secondary | ICD-10-CM | POA: Diagnosis not present

## 2015-12-06 DIAGNOSIS — J3089 Other allergic rhinitis: Secondary | ICD-10-CM | POA: Diagnosis not present

## 2015-12-06 DIAGNOSIS — J301 Allergic rhinitis due to pollen: Secondary | ICD-10-CM | POA: Diagnosis not present

## 2015-12-06 DIAGNOSIS — J3081 Allergic rhinitis due to animal (cat) (dog) hair and dander: Secondary | ICD-10-CM | POA: Diagnosis not present

## 2015-12-13 DIAGNOSIS — J3089 Other allergic rhinitis: Secondary | ICD-10-CM | POA: Diagnosis not present

## 2015-12-13 DIAGNOSIS — J301 Allergic rhinitis due to pollen: Secondary | ICD-10-CM | POA: Diagnosis not present

## 2015-12-13 DIAGNOSIS — J3081 Allergic rhinitis due to animal (cat) (dog) hair and dander: Secondary | ICD-10-CM | POA: Diagnosis not present

## 2015-12-20 DIAGNOSIS — J301 Allergic rhinitis due to pollen: Secondary | ICD-10-CM | POA: Diagnosis not present

## 2015-12-20 DIAGNOSIS — J3081 Allergic rhinitis due to animal (cat) (dog) hair and dander: Secondary | ICD-10-CM | POA: Diagnosis not present

## 2015-12-20 DIAGNOSIS — J3089 Other allergic rhinitis: Secondary | ICD-10-CM | POA: Diagnosis not present

## 2015-12-27 DIAGNOSIS — J3081 Allergic rhinitis due to animal (cat) (dog) hair and dander: Secondary | ICD-10-CM | POA: Diagnosis not present

## 2015-12-27 DIAGNOSIS — J3089 Other allergic rhinitis: Secondary | ICD-10-CM | POA: Diagnosis not present

## 2015-12-27 DIAGNOSIS — J301 Allergic rhinitis due to pollen: Secondary | ICD-10-CM | POA: Diagnosis not present

## 2016-01-03 DIAGNOSIS — J301 Allergic rhinitis due to pollen: Secondary | ICD-10-CM | POA: Diagnosis not present

## 2016-01-03 DIAGNOSIS — J3081 Allergic rhinitis due to animal (cat) (dog) hair and dander: Secondary | ICD-10-CM | POA: Diagnosis not present

## 2016-01-03 DIAGNOSIS — J3089 Other allergic rhinitis: Secondary | ICD-10-CM | POA: Diagnosis not present

## 2016-01-10 DIAGNOSIS — J301 Allergic rhinitis due to pollen: Secondary | ICD-10-CM | POA: Diagnosis not present

## 2016-01-10 DIAGNOSIS — J3081 Allergic rhinitis due to animal (cat) (dog) hair and dander: Secondary | ICD-10-CM | POA: Diagnosis not present

## 2016-01-10 DIAGNOSIS — J3089 Other allergic rhinitis: Secondary | ICD-10-CM | POA: Diagnosis not present

## 2016-01-14 DIAGNOSIS — J301 Allergic rhinitis due to pollen: Secondary | ICD-10-CM | POA: Diagnosis not present

## 2016-01-14 DIAGNOSIS — J3089 Other allergic rhinitis: Secondary | ICD-10-CM | POA: Diagnosis not present

## 2016-01-14 DIAGNOSIS — J3081 Allergic rhinitis due to animal (cat) (dog) hair and dander: Secondary | ICD-10-CM | POA: Diagnosis not present

## 2016-01-24 DIAGNOSIS — J3089 Other allergic rhinitis: Secondary | ICD-10-CM | POA: Diagnosis not present

## 2016-01-24 DIAGNOSIS — J3081 Allergic rhinitis due to animal (cat) (dog) hair and dander: Secondary | ICD-10-CM | POA: Diagnosis not present

## 2016-01-24 DIAGNOSIS — J301 Allergic rhinitis due to pollen: Secondary | ICD-10-CM | POA: Diagnosis not present

## 2016-01-31 DIAGNOSIS — J301 Allergic rhinitis due to pollen: Secondary | ICD-10-CM | POA: Diagnosis not present

## 2016-01-31 DIAGNOSIS — J3089 Other allergic rhinitis: Secondary | ICD-10-CM | POA: Diagnosis not present

## 2016-01-31 DIAGNOSIS — J3081 Allergic rhinitis due to animal (cat) (dog) hair and dander: Secondary | ICD-10-CM | POA: Diagnosis not present

## 2016-02-07 DIAGNOSIS — J301 Allergic rhinitis due to pollen: Secondary | ICD-10-CM | POA: Diagnosis not present

## 2016-02-07 DIAGNOSIS — J3081 Allergic rhinitis due to animal (cat) (dog) hair and dander: Secondary | ICD-10-CM | POA: Diagnosis not present

## 2016-02-07 DIAGNOSIS — J3089 Other allergic rhinitis: Secondary | ICD-10-CM | POA: Diagnosis not present

## 2016-02-14 DIAGNOSIS — J3089 Other allergic rhinitis: Secondary | ICD-10-CM | POA: Diagnosis not present

## 2016-02-14 DIAGNOSIS — J301 Allergic rhinitis due to pollen: Secondary | ICD-10-CM | POA: Diagnosis not present

## 2016-02-14 DIAGNOSIS — J3081 Allergic rhinitis due to animal (cat) (dog) hair and dander: Secondary | ICD-10-CM | POA: Diagnosis not present

## 2016-02-21 DIAGNOSIS — J3089 Other allergic rhinitis: Secondary | ICD-10-CM | POA: Diagnosis not present

## 2016-02-21 DIAGNOSIS — J3081 Allergic rhinitis due to animal (cat) (dog) hair and dander: Secondary | ICD-10-CM | POA: Diagnosis not present

## 2016-02-21 DIAGNOSIS — J301 Allergic rhinitis due to pollen: Secondary | ICD-10-CM | POA: Diagnosis not present

## 2016-02-28 DIAGNOSIS — J3089 Other allergic rhinitis: Secondary | ICD-10-CM | POA: Diagnosis not present

## 2016-02-28 DIAGNOSIS — J301 Allergic rhinitis due to pollen: Secondary | ICD-10-CM | POA: Diagnosis not present

## 2016-02-28 DIAGNOSIS — J3081 Allergic rhinitis due to animal (cat) (dog) hair and dander: Secondary | ICD-10-CM | POA: Diagnosis not present

## 2016-03-06 DIAGNOSIS — J3089 Other allergic rhinitis: Secondary | ICD-10-CM | POA: Diagnosis not present

## 2016-03-06 DIAGNOSIS — J301 Allergic rhinitis due to pollen: Secondary | ICD-10-CM | POA: Diagnosis not present

## 2016-03-06 DIAGNOSIS — J3081 Allergic rhinitis due to animal (cat) (dog) hair and dander: Secondary | ICD-10-CM | POA: Diagnosis not present

## 2016-03-13 DIAGNOSIS — J3089 Other allergic rhinitis: Secondary | ICD-10-CM | POA: Diagnosis not present

## 2016-03-13 DIAGNOSIS — J301 Allergic rhinitis due to pollen: Secondary | ICD-10-CM | POA: Diagnosis not present

## 2016-03-13 DIAGNOSIS — J3081 Allergic rhinitis due to animal (cat) (dog) hair and dander: Secondary | ICD-10-CM | POA: Diagnosis not present

## 2016-03-20 DIAGNOSIS — J3081 Allergic rhinitis due to animal (cat) (dog) hair and dander: Secondary | ICD-10-CM | POA: Diagnosis not present

## 2016-03-20 DIAGNOSIS — J301 Allergic rhinitis due to pollen: Secondary | ICD-10-CM | POA: Diagnosis not present

## 2016-03-20 DIAGNOSIS — J3089 Other allergic rhinitis: Secondary | ICD-10-CM | POA: Diagnosis not present

## 2016-03-27 DIAGNOSIS — J3089 Other allergic rhinitis: Secondary | ICD-10-CM | POA: Diagnosis not present

## 2016-03-27 DIAGNOSIS — J301 Allergic rhinitis due to pollen: Secondary | ICD-10-CM | POA: Diagnosis not present

## 2016-03-27 DIAGNOSIS — J3081 Allergic rhinitis due to animal (cat) (dog) hair and dander: Secondary | ICD-10-CM | POA: Diagnosis not present

## 2016-04-03 DIAGNOSIS — J3081 Allergic rhinitis due to animal (cat) (dog) hair and dander: Secondary | ICD-10-CM | POA: Diagnosis not present

## 2016-04-03 DIAGNOSIS — J301 Allergic rhinitis due to pollen: Secondary | ICD-10-CM | POA: Diagnosis not present

## 2016-04-03 DIAGNOSIS — J3089 Other allergic rhinitis: Secondary | ICD-10-CM | POA: Diagnosis not present

## 2016-04-10 DIAGNOSIS — J301 Allergic rhinitis due to pollen: Secondary | ICD-10-CM | POA: Diagnosis not present

## 2016-04-10 DIAGNOSIS — J3089 Other allergic rhinitis: Secondary | ICD-10-CM | POA: Diagnosis not present

## 2016-04-10 DIAGNOSIS — J3081 Allergic rhinitis due to animal (cat) (dog) hair and dander: Secondary | ICD-10-CM | POA: Diagnosis not present

## 2016-04-17 DIAGNOSIS — J3089 Other allergic rhinitis: Secondary | ICD-10-CM | POA: Diagnosis not present

## 2016-04-17 DIAGNOSIS — J301 Allergic rhinitis due to pollen: Secondary | ICD-10-CM | POA: Diagnosis not present

## 2016-04-17 DIAGNOSIS — J3081 Allergic rhinitis due to animal (cat) (dog) hair and dander: Secondary | ICD-10-CM | POA: Diagnosis not present

## 2016-05-01 DIAGNOSIS — J3081 Allergic rhinitis due to animal (cat) (dog) hair and dander: Secondary | ICD-10-CM | POA: Diagnosis not present

## 2016-05-01 DIAGNOSIS — J3089 Other allergic rhinitis: Secondary | ICD-10-CM | POA: Diagnosis not present

## 2016-05-01 DIAGNOSIS — J301 Allergic rhinitis due to pollen: Secondary | ICD-10-CM | POA: Diagnosis not present

## 2016-05-08 DIAGNOSIS — J3081 Allergic rhinitis due to animal (cat) (dog) hair and dander: Secondary | ICD-10-CM | POA: Diagnosis not present

## 2016-05-08 DIAGNOSIS — J3089 Other allergic rhinitis: Secondary | ICD-10-CM | POA: Diagnosis not present

## 2016-05-08 DIAGNOSIS — J301 Allergic rhinitis due to pollen: Secondary | ICD-10-CM | POA: Diagnosis not present

## 2016-05-15 DIAGNOSIS — J3081 Allergic rhinitis due to animal (cat) (dog) hair and dander: Secondary | ICD-10-CM | POA: Diagnosis not present

## 2016-05-15 DIAGNOSIS — J301 Allergic rhinitis due to pollen: Secondary | ICD-10-CM | POA: Diagnosis not present

## 2016-05-15 DIAGNOSIS — J3089 Other allergic rhinitis: Secondary | ICD-10-CM | POA: Diagnosis not present

## 2016-05-22 DIAGNOSIS — M2550 Pain in unspecified joint: Secondary | ICD-10-CM | POA: Diagnosis not present

## 2016-05-22 DIAGNOSIS — J3089 Other allergic rhinitis: Secondary | ICD-10-CM | POA: Diagnosis not present

## 2016-05-22 DIAGNOSIS — Z803 Family history of malignant neoplasm of breast: Secondary | ICD-10-CM | POA: Diagnosis not present

## 2016-05-22 DIAGNOSIS — J301 Allergic rhinitis due to pollen: Secondary | ICD-10-CM | POA: Diagnosis not present

## 2016-05-22 DIAGNOSIS — J3081 Allergic rhinitis due to animal (cat) (dog) hair and dander: Secondary | ICD-10-CM | POA: Diagnosis not present

## 2016-05-22 DIAGNOSIS — E1122 Type 2 diabetes mellitus with diabetic chronic kidney disease: Secondary | ICD-10-CM | POA: Diagnosis not present

## 2016-05-22 DIAGNOSIS — E7800 Pure hypercholesterolemia, unspecified: Secondary | ICD-10-CM | POA: Diagnosis not present

## 2016-05-22 DIAGNOSIS — Z23 Encounter for immunization: Secondary | ICD-10-CM | POA: Diagnosis not present

## 2016-05-29 DIAGNOSIS — J3081 Allergic rhinitis due to animal (cat) (dog) hair and dander: Secondary | ICD-10-CM | POA: Diagnosis not present

## 2016-05-29 DIAGNOSIS — J3089 Other allergic rhinitis: Secondary | ICD-10-CM | POA: Diagnosis not present

## 2016-05-29 DIAGNOSIS — J301 Allergic rhinitis due to pollen: Secondary | ICD-10-CM | POA: Diagnosis not present

## 2016-06-05 DIAGNOSIS — J3089 Other allergic rhinitis: Secondary | ICD-10-CM | POA: Diagnosis not present

## 2016-06-05 DIAGNOSIS — J3081 Allergic rhinitis due to animal (cat) (dog) hair and dander: Secondary | ICD-10-CM | POA: Diagnosis not present

## 2016-06-05 DIAGNOSIS — J301 Allergic rhinitis due to pollen: Secondary | ICD-10-CM | POA: Diagnosis not present

## 2016-06-12 DIAGNOSIS — J3081 Allergic rhinitis due to animal (cat) (dog) hair and dander: Secondary | ICD-10-CM | POA: Diagnosis not present

## 2016-06-12 DIAGNOSIS — J3089 Other allergic rhinitis: Secondary | ICD-10-CM | POA: Diagnosis not present

## 2016-06-12 DIAGNOSIS — J301 Allergic rhinitis due to pollen: Secondary | ICD-10-CM | POA: Diagnosis not present

## 2016-06-19 DIAGNOSIS — J301 Allergic rhinitis due to pollen: Secondary | ICD-10-CM | POA: Diagnosis not present

## 2016-06-19 DIAGNOSIS — J3081 Allergic rhinitis due to animal (cat) (dog) hair and dander: Secondary | ICD-10-CM | POA: Diagnosis not present

## 2016-06-19 DIAGNOSIS — J3089 Other allergic rhinitis: Secondary | ICD-10-CM | POA: Diagnosis not present

## 2016-06-23 DIAGNOSIS — J3081 Allergic rhinitis due to animal (cat) (dog) hair and dander: Secondary | ICD-10-CM | POA: Diagnosis not present

## 2016-06-23 DIAGNOSIS — J301 Allergic rhinitis due to pollen: Secondary | ICD-10-CM | POA: Diagnosis not present

## 2016-06-23 DIAGNOSIS — J3089 Other allergic rhinitis: Secondary | ICD-10-CM | POA: Diagnosis not present

## 2016-06-25 DIAGNOSIS — J3081 Allergic rhinitis due to animal (cat) (dog) hair and dander: Secondary | ICD-10-CM | POA: Diagnosis not present

## 2016-06-25 DIAGNOSIS — J3089 Other allergic rhinitis: Secondary | ICD-10-CM | POA: Diagnosis not present

## 2016-06-25 DIAGNOSIS — J301 Allergic rhinitis due to pollen: Secondary | ICD-10-CM | POA: Diagnosis not present

## 2016-07-03 DIAGNOSIS — J3081 Allergic rhinitis due to animal (cat) (dog) hair and dander: Secondary | ICD-10-CM | POA: Diagnosis not present

## 2016-07-03 DIAGNOSIS — J301 Allergic rhinitis due to pollen: Secondary | ICD-10-CM | POA: Diagnosis not present

## 2016-07-03 DIAGNOSIS — J3089 Other allergic rhinitis: Secondary | ICD-10-CM | POA: Diagnosis not present

## 2016-07-10 DIAGNOSIS — J3081 Allergic rhinitis due to animal (cat) (dog) hair and dander: Secondary | ICD-10-CM | POA: Diagnosis not present

## 2016-07-10 DIAGNOSIS — J301 Allergic rhinitis due to pollen: Secondary | ICD-10-CM | POA: Diagnosis not present

## 2016-07-10 DIAGNOSIS — J3089 Other allergic rhinitis: Secondary | ICD-10-CM | POA: Diagnosis not present

## 2016-07-17 DIAGNOSIS — J301 Allergic rhinitis due to pollen: Secondary | ICD-10-CM | POA: Diagnosis not present

## 2016-07-17 DIAGNOSIS — J3081 Allergic rhinitis due to animal (cat) (dog) hair and dander: Secondary | ICD-10-CM | POA: Diagnosis not present

## 2016-07-17 DIAGNOSIS — J3089 Other allergic rhinitis: Secondary | ICD-10-CM | POA: Diagnosis not present

## 2016-07-24 DIAGNOSIS — J301 Allergic rhinitis due to pollen: Secondary | ICD-10-CM | POA: Diagnosis not present

## 2016-07-24 DIAGNOSIS — J3081 Allergic rhinitis due to animal (cat) (dog) hair and dander: Secondary | ICD-10-CM | POA: Diagnosis not present

## 2016-07-24 DIAGNOSIS — J3089 Other allergic rhinitis: Secondary | ICD-10-CM | POA: Diagnosis not present

## 2016-07-31 DIAGNOSIS — J301 Allergic rhinitis due to pollen: Secondary | ICD-10-CM | POA: Diagnosis not present

## 2016-07-31 DIAGNOSIS — J3081 Allergic rhinitis due to animal (cat) (dog) hair and dander: Secondary | ICD-10-CM | POA: Diagnosis not present

## 2016-07-31 DIAGNOSIS — J3089 Other allergic rhinitis: Secondary | ICD-10-CM | POA: Diagnosis not present

## 2016-08-07 DIAGNOSIS — J3089 Other allergic rhinitis: Secondary | ICD-10-CM | POA: Diagnosis not present

## 2016-08-07 DIAGNOSIS — J3081 Allergic rhinitis due to animal (cat) (dog) hair and dander: Secondary | ICD-10-CM | POA: Diagnosis not present

## 2016-08-07 DIAGNOSIS — J301 Allergic rhinitis due to pollen: Secondary | ICD-10-CM | POA: Diagnosis not present

## 2016-08-14 ENCOUNTER — Ambulatory Visit
Admission: RE | Admit: 2016-08-14 | Discharge: 2016-08-14 | Disposition: A | Discharge status: Home / Self Care | Payer: Commercial Managed Care - HMO | Source: Ambulatory Visit | Attending: Family Medicine | Admitting: Family Medicine

## 2016-08-14 ENCOUNTER — Other Ambulatory Visit: Payer: Self-pay | Admitting: Family Medicine

## 2016-08-14 DIAGNOSIS — I10 Essential (primary) hypertension: Secondary | ICD-10-CM | POA: Diagnosis not present

## 2016-08-14 DIAGNOSIS — E1122 Type 2 diabetes mellitus with diabetic chronic kidney disease: Secondary | ICD-10-CM | POA: Diagnosis not present

## 2016-08-14 DIAGNOSIS — G4459 Other complicated headache syndrome: Secondary | ICD-10-CM

## 2016-08-14 DIAGNOSIS — Z6833 Body mass index (BMI) 33.0-33.9, adult: Secondary | ICD-10-CM | POA: Diagnosis not present

## 2016-08-14 DIAGNOSIS — Z79899 Other long term (current) drug therapy: Secondary | ICD-10-CM | POA: Diagnosis not present

## 2016-08-14 DIAGNOSIS — R51 Headache: Secondary | ICD-10-CM | POA: Diagnosis not present

## 2016-08-14 DIAGNOSIS — J301 Allergic rhinitis due to pollen: Secondary | ICD-10-CM | POA: Diagnosis not present

## 2016-08-14 DIAGNOSIS — Z1159 Encounter for screening for other viral diseases: Secondary | ICD-10-CM | POA: Diagnosis not present

## 2016-08-14 DIAGNOSIS — I779 Disorder of arteries and arterioles, unspecified: Secondary | ICD-10-CM | POA: Diagnosis not present

## 2016-08-14 DIAGNOSIS — Z0000 Encounter for general adult medical examination without abnormal findings: Secondary | ICD-10-CM | POA: Diagnosis not present

## 2016-08-14 DIAGNOSIS — J3089 Other allergic rhinitis: Secondary | ICD-10-CM | POA: Diagnosis not present

## 2016-08-14 DIAGNOSIS — J3081 Allergic rhinitis due to animal (cat) (dog) hair and dander: Secondary | ICD-10-CM | POA: Diagnosis not present

## 2016-08-14 DIAGNOSIS — E559 Vitamin D deficiency, unspecified: Secondary | ICD-10-CM | POA: Diagnosis not present

## 2016-08-14 DIAGNOSIS — M6530 Trigger finger, unspecified finger: Secondary | ICD-10-CM | POA: Diagnosis not present

## 2016-08-27 ENCOUNTER — Other Ambulatory Visit: Payer: Self-pay | Admitting: Family Medicine

## 2016-08-27 DIAGNOSIS — Z1231 Encounter for screening mammogram for malignant neoplasm of breast: Secondary | ICD-10-CM

## 2016-08-27 DIAGNOSIS — Z0000 Encounter for general adult medical examination without abnormal findings: Secondary | ICD-10-CM | POA: Diagnosis not present

## 2016-08-28 ENCOUNTER — Other Ambulatory Visit: Payer: Self-pay | Admitting: Family Medicine

## 2016-08-28 DIAGNOSIS — J301 Allergic rhinitis due to pollen: Secondary | ICD-10-CM | POA: Diagnosis not present

## 2016-08-28 DIAGNOSIS — J3081 Allergic rhinitis due to animal (cat) (dog) hair and dander: Secondary | ICD-10-CM | POA: Diagnosis not present

## 2016-08-28 DIAGNOSIS — E2839 Other primary ovarian failure: Secondary | ICD-10-CM

## 2016-08-28 DIAGNOSIS — J3089 Other allergic rhinitis: Secondary | ICD-10-CM | POA: Diagnosis not present

## 2016-09-04 DIAGNOSIS — J3081 Allergic rhinitis due to animal (cat) (dog) hair and dander: Secondary | ICD-10-CM | POA: Diagnosis not present

## 2016-09-04 DIAGNOSIS — J3089 Other allergic rhinitis: Secondary | ICD-10-CM | POA: Diagnosis not present

## 2016-09-04 DIAGNOSIS — J301 Allergic rhinitis due to pollen: Secondary | ICD-10-CM | POA: Diagnosis not present

## 2016-09-11 DIAGNOSIS — J301 Allergic rhinitis due to pollen: Secondary | ICD-10-CM | POA: Diagnosis not present

## 2016-09-11 DIAGNOSIS — J3089 Other allergic rhinitis: Secondary | ICD-10-CM | POA: Diagnosis not present

## 2016-09-11 DIAGNOSIS — J3081 Allergic rhinitis due to animal (cat) (dog) hair and dander: Secondary | ICD-10-CM | POA: Diagnosis not present

## 2016-09-18 DIAGNOSIS — J3089 Other allergic rhinitis: Secondary | ICD-10-CM | POA: Diagnosis not present

## 2016-09-18 DIAGNOSIS — J301 Allergic rhinitis due to pollen: Secondary | ICD-10-CM | POA: Diagnosis not present

## 2016-09-18 DIAGNOSIS — J3081 Allergic rhinitis due to animal (cat) (dog) hair and dander: Secondary | ICD-10-CM | POA: Diagnosis not present

## 2016-09-25 DIAGNOSIS — J3089 Other allergic rhinitis: Secondary | ICD-10-CM | POA: Diagnosis not present

## 2016-09-25 DIAGNOSIS — J3081 Allergic rhinitis due to animal (cat) (dog) hair and dander: Secondary | ICD-10-CM | POA: Diagnosis not present

## 2016-09-25 DIAGNOSIS — J301 Allergic rhinitis due to pollen: Secondary | ICD-10-CM | POA: Diagnosis not present

## 2016-10-02 DIAGNOSIS — J3089 Other allergic rhinitis: Secondary | ICD-10-CM | POA: Diagnosis not present

## 2016-10-02 DIAGNOSIS — J3081 Allergic rhinitis due to animal (cat) (dog) hair and dander: Secondary | ICD-10-CM | POA: Diagnosis not present

## 2016-10-02 DIAGNOSIS — J301 Allergic rhinitis due to pollen: Secondary | ICD-10-CM | POA: Diagnosis not present

## 2016-10-09 DIAGNOSIS — J301 Allergic rhinitis due to pollen: Secondary | ICD-10-CM | POA: Diagnosis not present

## 2016-10-09 DIAGNOSIS — J3089 Other allergic rhinitis: Secondary | ICD-10-CM | POA: Diagnosis not present

## 2016-10-09 DIAGNOSIS — J3081 Allergic rhinitis due to animal (cat) (dog) hair and dander: Secondary | ICD-10-CM | POA: Diagnosis not present

## 2016-10-16 DIAGNOSIS — M7541 Impingement syndrome of right shoulder: Secondary | ICD-10-CM | POA: Diagnosis not present

## 2016-10-16 DIAGNOSIS — J3081 Allergic rhinitis due to animal (cat) (dog) hair and dander: Secondary | ICD-10-CM | POA: Diagnosis not present

## 2016-10-16 DIAGNOSIS — J3089 Other allergic rhinitis: Secondary | ICD-10-CM | POA: Diagnosis not present

## 2016-10-16 DIAGNOSIS — J301 Allergic rhinitis due to pollen: Secondary | ICD-10-CM | POA: Diagnosis not present

## 2016-10-16 DIAGNOSIS — M65342 Trigger finger, left ring finger: Secondary | ICD-10-CM | POA: Diagnosis not present

## 2016-10-30 DIAGNOSIS — J3089 Other allergic rhinitis: Secondary | ICD-10-CM | POA: Diagnosis not present

## 2016-10-30 DIAGNOSIS — J301 Allergic rhinitis due to pollen: Secondary | ICD-10-CM | POA: Diagnosis not present

## 2016-10-30 DIAGNOSIS — J3081 Allergic rhinitis due to animal (cat) (dog) hair and dander: Secondary | ICD-10-CM | POA: Diagnosis not present

## 2016-11-05 DIAGNOSIS — J3089 Other allergic rhinitis: Secondary | ICD-10-CM | POA: Diagnosis not present

## 2016-11-05 DIAGNOSIS — J3081 Allergic rhinitis due to animal (cat) (dog) hair and dander: Secondary | ICD-10-CM | POA: Diagnosis not present

## 2016-11-05 DIAGNOSIS — J301 Allergic rhinitis due to pollen: Secondary | ICD-10-CM | POA: Diagnosis not present

## 2016-11-13 DIAGNOSIS — J301 Allergic rhinitis due to pollen: Secondary | ICD-10-CM | POA: Diagnosis not present

## 2016-11-13 DIAGNOSIS — J3089 Other allergic rhinitis: Secondary | ICD-10-CM | POA: Diagnosis not present

## 2016-11-13 DIAGNOSIS — J3081 Allergic rhinitis due to animal (cat) (dog) hair and dander: Secondary | ICD-10-CM | POA: Diagnosis not present

## 2016-11-14 DIAGNOSIS — M65342 Trigger finger, left ring finger: Secondary | ICD-10-CM | POA: Diagnosis not present

## 2016-11-14 DIAGNOSIS — M67442 Ganglion, left hand: Secondary | ICD-10-CM | POA: Diagnosis not present

## 2016-11-20 DIAGNOSIS — J3081 Allergic rhinitis due to animal (cat) (dog) hair and dander: Secondary | ICD-10-CM | POA: Diagnosis not present

## 2016-11-20 DIAGNOSIS — J301 Allergic rhinitis due to pollen: Secondary | ICD-10-CM | POA: Diagnosis not present

## 2016-11-20 DIAGNOSIS — J3089 Other allergic rhinitis: Secondary | ICD-10-CM | POA: Diagnosis not present

## 2016-11-27 DIAGNOSIS — J3089 Other allergic rhinitis: Secondary | ICD-10-CM | POA: Diagnosis not present

## 2016-11-27 DIAGNOSIS — J301 Allergic rhinitis due to pollen: Secondary | ICD-10-CM | POA: Diagnosis not present

## 2016-11-27 DIAGNOSIS — J3081 Allergic rhinitis due to animal (cat) (dog) hair and dander: Secondary | ICD-10-CM | POA: Diagnosis not present

## 2016-12-03 DIAGNOSIS — J3081 Allergic rhinitis due to animal (cat) (dog) hair and dander: Secondary | ICD-10-CM | POA: Diagnosis not present

## 2016-12-03 DIAGNOSIS — J3089 Other allergic rhinitis: Secondary | ICD-10-CM | POA: Diagnosis not present

## 2016-12-03 DIAGNOSIS — J301 Allergic rhinitis due to pollen: Secondary | ICD-10-CM | POA: Diagnosis not present

## 2016-12-18 DIAGNOSIS — J301 Allergic rhinitis due to pollen: Secondary | ICD-10-CM | POA: Diagnosis not present

## 2016-12-18 DIAGNOSIS — J3081 Allergic rhinitis due to animal (cat) (dog) hair and dander: Secondary | ICD-10-CM | POA: Diagnosis not present

## 2016-12-18 DIAGNOSIS — J3089 Other allergic rhinitis: Secondary | ICD-10-CM | POA: Diagnosis not present

## 2017-01-01 DIAGNOSIS — J301 Allergic rhinitis due to pollen: Secondary | ICD-10-CM | POA: Diagnosis not present

## 2017-01-01 DIAGNOSIS — J3089 Other allergic rhinitis: Secondary | ICD-10-CM | POA: Diagnosis not present

## 2017-01-01 DIAGNOSIS — J3081 Allergic rhinitis due to animal (cat) (dog) hair and dander: Secondary | ICD-10-CM | POA: Diagnosis not present

## 2017-01-08 DIAGNOSIS — J3081 Allergic rhinitis due to animal (cat) (dog) hair and dander: Secondary | ICD-10-CM | POA: Diagnosis not present

## 2017-01-08 DIAGNOSIS — J301 Allergic rhinitis due to pollen: Secondary | ICD-10-CM | POA: Diagnosis not present

## 2017-01-08 DIAGNOSIS — J3089 Other allergic rhinitis: Secondary | ICD-10-CM | POA: Diagnosis not present

## 2017-01-15 DIAGNOSIS — J3089 Other allergic rhinitis: Secondary | ICD-10-CM | POA: Diagnosis not present

## 2017-01-15 DIAGNOSIS — J301 Allergic rhinitis due to pollen: Secondary | ICD-10-CM | POA: Diagnosis not present

## 2017-01-22 DIAGNOSIS — J301 Allergic rhinitis due to pollen: Secondary | ICD-10-CM | POA: Diagnosis not present

## 2017-01-22 DIAGNOSIS — J3081 Allergic rhinitis due to animal (cat) (dog) hair and dander: Secondary | ICD-10-CM | POA: Diagnosis not present

## 2017-01-22 DIAGNOSIS — J3089 Other allergic rhinitis: Secondary | ICD-10-CM | POA: Diagnosis not present

## 2017-01-28 DIAGNOSIS — J3081 Allergic rhinitis due to animal (cat) (dog) hair and dander: Secondary | ICD-10-CM | POA: Diagnosis not present

## 2017-01-28 DIAGNOSIS — J301 Allergic rhinitis due to pollen: Secondary | ICD-10-CM | POA: Diagnosis not present

## 2017-01-28 DIAGNOSIS — J3089 Other allergic rhinitis: Secondary | ICD-10-CM | POA: Diagnosis not present

## 2017-02-12 DIAGNOSIS — J3081 Allergic rhinitis due to animal (cat) (dog) hair and dander: Secondary | ICD-10-CM | POA: Diagnosis not present

## 2017-02-12 DIAGNOSIS — J301 Allergic rhinitis due to pollen: Secondary | ICD-10-CM | POA: Diagnosis not present

## 2017-02-12 DIAGNOSIS — J3089 Other allergic rhinitis: Secondary | ICD-10-CM | POA: Diagnosis not present

## 2017-02-18 DIAGNOSIS — J301 Allergic rhinitis due to pollen: Secondary | ICD-10-CM | POA: Diagnosis not present

## 2017-02-18 DIAGNOSIS — J3081 Allergic rhinitis due to animal (cat) (dog) hair and dander: Secondary | ICD-10-CM | POA: Diagnosis not present

## 2017-02-18 DIAGNOSIS — J3089 Other allergic rhinitis: Secondary | ICD-10-CM | POA: Diagnosis not present

## 2017-02-26 DIAGNOSIS — J3081 Allergic rhinitis due to animal (cat) (dog) hair and dander: Secondary | ICD-10-CM | POA: Diagnosis not present

## 2017-02-26 DIAGNOSIS — J301 Allergic rhinitis due to pollen: Secondary | ICD-10-CM | POA: Diagnosis not present

## 2017-02-26 DIAGNOSIS — J3089 Other allergic rhinitis: Secondary | ICD-10-CM | POA: Diagnosis not present

## 2017-03-04 DIAGNOSIS — J3081 Allergic rhinitis due to animal (cat) (dog) hair and dander: Secondary | ICD-10-CM | POA: Diagnosis not present

## 2017-03-04 DIAGNOSIS — J301 Allergic rhinitis due to pollen: Secondary | ICD-10-CM | POA: Diagnosis not present

## 2017-03-04 DIAGNOSIS — J3089 Other allergic rhinitis: Secondary | ICD-10-CM | POA: Diagnosis not present

## 2017-03-11 DIAGNOSIS — J3089 Other allergic rhinitis: Secondary | ICD-10-CM | POA: Diagnosis not present

## 2017-03-11 DIAGNOSIS — J3081 Allergic rhinitis due to animal (cat) (dog) hair and dander: Secondary | ICD-10-CM | POA: Diagnosis not present

## 2017-03-11 DIAGNOSIS — J301 Allergic rhinitis due to pollen: Secondary | ICD-10-CM | POA: Diagnosis not present

## 2017-03-19 DIAGNOSIS — J3081 Allergic rhinitis due to animal (cat) (dog) hair and dander: Secondary | ICD-10-CM | POA: Diagnosis not present

## 2017-03-19 DIAGNOSIS — J3089 Other allergic rhinitis: Secondary | ICD-10-CM | POA: Diagnosis not present

## 2017-03-19 DIAGNOSIS — J301 Allergic rhinitis due to pollen: Secondary | ICD-10-CM | POA: Diagnosis not present

## 2017-03-26 DIAGNOSIS — J3081 Allergic rhinitis due to animal (cat) (dog) hair and dander: Secondary | ICD-10-CM | POA: Diagnosis not present

## 2017-03-26 DIAGNOSIS — J3089 Other allergic rhinitis: Secondary | ICD-10-CM | POA: Diagnosis not present

## 2017-03-26 DIAGNOSIS — J301 Allergic rhinitis due to pollen: Secondary | ICD-10-CM | POA: Diagnosis not present

## 2017-04-02 DIAGNOSIS — J301 Allergic rhinitis due to pollen: Secondary | ICD-10-CM | POA: Diagnosis not present

## 2017-04-02 DIAGNOSIS — J3081 Allergic rhinitis due to animal (cat) (dog) hair and dander: Secondary | ICD-10-CM | POA: Diagnosis not present

## 2017-04-02 DIAGNOSIS — J3089 Other allergic rhinitis: Secondary | ICD-10-CM | POA: Diagnosis not present

## 2017-04-09 DIAGNOSIS — J3081 Allergic rhinitis due to animal (cat) (dog) hair and dander: Secondary | ICD-10-CM | POA: Diagnosis not present

## 2017-04-09 DIAGNOSIS — J3089 Other allergic rhinitis: Secondary | ICD-10-CM | POA: Diagnosis not present

## 2017-04-09 DIAGNOSIS — J301 Allergic rhinitis due to pollen: Secondary | ICD-10-CM | POA: Diagnosis not present

## 2017-04-15 DIAGNOSIS — J3081 Allergic rhinitis due to animal (cat) (dog) hair and dander: Secondary | ICD-10-CM | POA: Diagnosis not present

## 2017-04-15 DIAGNOSIS — J3089 Other allergic rhinitis: Secondary | ICD-10-CM | POA: Diagnosis not present

## 2017-04-15 DIAGNOSIS — J301 Allergic rhinitis due to pollen: Secondary | ICD-10-CM | POA: Diagnosis not present

## 2017-04-23 DIAGNOSIS — J3089 Other allergic rhinitis: Secondary | ICD-10-CM | POA: Diagnosis not present

## 2017-04-23 DIAGNOSIS — J301 Allergic rhinitis due to pollen: Secondary | ICD-10-CM | POA: Diagnosis not present

## 2017-04-23 DIAGNOSIS — J3081 Allergic rhinitis due to animal (cat) (dog) hair and dander: Secondary | ICD-10-CM | POA: Diagnosis not present

## 2017-04-30 DIAGNOSIS — J3081 Allergic rhinitis due to animal (cat) (dog) hair and dander: Secondary | ICD-10-CM | POA: Diagnosis not present

## 2017-04-30 DIAGNOSIS — J301 Allergic rhinitis due to pollen: Secondary | ICD-10-CM | POA: Diagnosis not present

## 2017-04-30 DIAGNOSIS — J3089 Other allergic rhinitis: Secondary | ICD-10-CM | POA: Diagnosis not present

## 2017-05-07 DIAGNOSIS — J3081 Allergic rhinitis due to animal (cat) (dog) hair and dander: Secondary | ICD-10-CM | POA: Diagnosis not present

## 2017-05-07 DIAGNOSIS — J301 Allergic rhinitis due to pollen: Secondary | ICD-10-CM | POA: Diagnosis not present

## 2017-05-07 DIAGNOSIS — J3089 Other allergic rhinitis: Secondary | ICD-10-CM | POA: Diagnosis not present

## 2017-05-21 DIAGNOSIS — J301 Allergic rhinitis due to pollen: Secondary | ICD-10-CM | POA: Diagnosis not present

## 2017-05-21 DIAGNOSIS — J3089 Other allergic rhinitis: Secondary | ICD-10-CM | POA: Diagnosis not present

## 2017-05-21 DIAGNOSIS — J3081 Allergic rhinitis due to animal (cat) (dog) hair and dander: Secondary | ICD-10-CM | POA: Diagnosis not present

## 2017-05-28 DIAGNOSIS — J301 Allergic rhinitis due to pollen: Secondary | ICD-10-CM | POA: Diagnosis not present

## 2017-05-28 DIAGNOSIS — J3089 Other allergic rhinitis: Secondary | ICD-10-CM | POA: Diagnosis not present

## 2017-05-28 DIAGNOSIS — J3081 Allergic rhinitis due to animal (cat) (dog) hair and dander: Secondary | ICD-10-CM | POA: Diagnosis not present

## 2017-06-04 DIAGNOSIS — J3089 Other allergic rhinitis: Secondary | ICD-10-CM | POA: Diagnosis not present

## 2017-06-04 DIAGNOSIS — J301 Allergic rhinitis due to pollen: Secondary | ICD-10-CM | POA: Diagnosis not present

## 2017-06-04 DIAGNOSIS — J3081 Allergic rhinitis due to animal (cat) (dog) hair and dander: Secondary | ICD-10-CM | POA: Diagnosis not present

## 2017-06-10 DIAGNOSIS — J3081 Allergic rhinitis due to animal (cat) (dog) hair and dander: Secondary | ICD-10-CM | POA: Diagnosis not present

## 2017-06-10 DIAGNOSIS — J301 Allergic rhinitis due to pollen: Secondary | ICD-10-CM | POA: Diagnosis not present

## 2017-06-10 DIAGNOSIS — J3089 Other allergic rhinitis: Secondary | ICD-10-CM | POA: Diagnosis not present

## 2017-06-17 DIAGNOSIS — J301 Allergic rhinitis due to pollen: Secondary | ICD-10-CM | POA: Diagnosis not present

## 2017-06-17 DIAGNOSIS — J3089 Other allergic rhinitis: Secondary | ICD-10-CM | POA: Diagnosis not present

## 2017-06-17 DIAGNOSIS — J3081 Allergic rhinitis due to animal (cat) (dog) hair and dander: Secondary | ICD-10-CM | POA: Diagnosis not present

## 2017-06-24 DIAGNOSIS — J3089 Other allergic rhinitis: Secondary | ICD-10-CM | POA: Diagnosis not present

## 2017-06-24 DIAGNOSIS — J3081 Allergic rhinitis due to animal (cat) (dog) hair and dander: Secondary | ICD-10-CM | POA: Diagnosis not present

## 2017-06-24 DIAGNOSIS — J301 Allergic rhinitis due to pollen: Secondary | ICD-10-CM | POA: Diagnosis not present

## 2017-07-02 DIAGNOSIS — J3089 Other allergic rhinitis: Secondary | ICD-10-CM | POA: Diagnosis not present

## 2017-07-02 DIAGNOSIS — J301 Allergic rhinitis due to pollen: Secondary | ICD-10-CM | POA: Diagnosis not present

## 2017-07-02 DIAGNOSIS — J3081 Allergic rhinitis due to animal (cat) (dog) hair and dander: Secondary | ICD-10-CM | POA: Diagnosis not present

## 2017-07-06 DIAGNOSIS — J3089 Other allergic rhinitis: Secondary | ICD-10-CM | POA: Diagnosis not present

## 2017-07-06 DIAGNOSIS — J301 Allergic rhinitis due to pollen: Secondary | ICD-10-CM | POA: Diagnosis not present

## 2017-07-06 DIAGNOSIS — J3081 Allergic rhinitis due to animal (cat) (dog) hair and dander: Secondary | ICD-10-CM | POA: Diagnosis not present

## 2017-07-09 DIAGNOSIS — J3089 Other allergic rhinitis: Secondary | ICD-10-CM | POA: Diagnosis not present

## 2017-07-09 DIAGNOSIS — J301 Allergic rhinitis due to pollen: Secondary | ICD-10-CM | POA: Diagnosis not present

## 2017-07-09 DIAGNOSIS — J3081 Allergic rhinitis due to animal (cat) (dog) hair and dander: Secondary | ICD-10-CM | POA: Diagnosis not present

## 2017-07-16 DIAGNOSIS — J301 Allergic rhinitis due to pollen: Secondary | ICD-10-CM | POA: Diagnosis not present

## 2017-07-16 DIAGNOSIS — J3089 Other allergic rhinitis: Secondary | ICD-10-CM | POA: Diagnosis not present

## 2017-07-16 DIAGNOSIS — J3081 Allergic rhinitis due to animal (cat) (dog) hair and dander: Secondary | ICD-10-CM | POA: Diagnosis not present

## 2017-08-06 DIAGNOSIS — J3089 Other allergic rhinitis: Secondary | ICD-10-CM | POA: Diagnosis not present

## 2017-08-06 DIAGNOSIS — J3081 Allergic rhinitis due to animal (cat) (dog) hair and dander: Secondary | ICD-10-CM | POA: Diagnosis not present

## 2017-08-06 DIAGNOSIS — J301 Allergic rhinitis due to pollen: Secondary | ICD-10-CM | POA: Diagnosis not present

## 2017-08-13 DIAGNOSIS — J3081 Allergic rhinitis due to animal (cat) (dog) hair and dander: Secondary | ICD-10-CM | POA: Diagnosis not present

## 2017-08-13 DIAGNOSIS — J301 Allergic rhinitis due to pollen: Secondary | ICD-10-CM | POA: Diagnosis not present

## 2017-08-13 DIAGNOSIS — J3089 Other allergic rhinitis: Secondary | ICD-10-CM | POA: Diagnosis not present

## 2017-08-20 ENCOUNTER — Ambulatory Visit
Admission: RE | Admit: 2017-08-20 | Discharge: 2017-08-20 | Disposition: A | Discharge status: Home / Self Care | Payer: Medicare HMO | Source: Ambulatory Visit | Attending: Licensed Clinical Social Worker | Admitting: Licensed Clinical Social Worker

## 2017-08-20 ENCOUNTER — Other Ambulatory Visit: Payer: Self-pay | Admitting: Licensed Clinical Social Worker

## 2017-08-20 DIAGNOSIS — J189 Pneumonia, unspecified organism: Secondary | ICD-10-CM | POA: Diagnosis not present

## 2017-08-20 DIAGNOSIS — R05 Cough: Secondary | ICD-10-CM | POA: Diagnosis not present

## 2017-08-20 DIAGNOSIS — R6889 Other general symptoms and signs: Secondary | ICD-10-CM | POA: Diagnosis not present

## 2017-08-20 DIAGNOSIS — N3000 Acute cystitis without hematuria: Secondary | ICD-10-CM | POA: Diagnosis not present

## 2017-08-27 DIAGNOSIS — J189 Pneumonia, unspecified organism: Secondary | ICD-10-CM | POA: Diagnosis not present

## 2017-08-27 DIAGNOSIS — N390 Urinary tract infection, site not specified: Secondary | ICD-10-CM | POA: Diagnosis not present

## 2017-09-03 DIAGNOSIS — N182 Chronic kidney disease, stage 2 (mild): Secondary | ICD-10-CM | POA: Diagnosis not present

## 2017-09-03 DIAGNOSIS — E1165 Type 2 diabetes mellitus with hyperglycemia: Secondary | ICD-10-CM | POA: Diagnosis not present

## 2017-09-03 DIAGNOSIS — Z79899 Other long term (current) drug therapy: Secondary | ICD-10-CM | POA: Diagnosis not present

## 2017-09-03 DIAGNOSIS — Z1211 Encounter for screening for malignant neoplasm of colon: Secondary | ICD-10-CM | POA: Diagnosis not present

## 2017-09-03 DIAGNOSIS — E559 Vitamin D deficiency, unspecified: Secondary | ICD-10-CM | POA: Diagnosis not present

## 2017-09-03 DIAGNOSIS — Z0000 Encounter for general adult medical examination without abnormal findings: Secondary | ICD-10-CM | POA: Diagnosis not present

## 2017-09-03 DIAGNOSIS — E1122 Type 2 diabetes mellitus with diabetic chronic kidney disease: Secondary | ICD-10-CM | POA: Diagnosis not present

## 2017-09-15 DIAGNOSIS — J301 Allergic rhinitis due to pollen: Secondary | ICD-10-CM | POA: Diagnosis not present

## 2017-09-15 DIAGNOSIS — J3089 Other allergic rhinitis: Secondary | ICD-10-CM | POA: Diagnosis not present

## 2017-09-15 DIAGNOSIS — J3081 Allergic rhinitis due to animal (cat) (dog) hair and dander: Secondary | ICD-10-CM | POA: Diagnosis not present

## 2017-09-22 DIAGNOSIS — J3089 Other allergic rhinitis: Secondary | ICD-10-CM | POA: Diagnosis not present

## 2017-09-22 DIAGNOSIS — J3081 Allergic rhinitis due to animal (cat) (dog) hair and dander: Secondary | ICD-10-CM | POA: Diagnosis not present

## 2017-09-22 DIAGNOSIS — J301 Allergic rhinitis due to pollen: Secondary | ICD-10-CM | POA: Diagnosis not present

## 2017-10-16 DIAGNOSIS — J3081 Allergic rhinitis due to animal (cat) (dog) hair and dander: Secondary | ICD-10-CM | POA: Diagnosis not present

## 2017-10-16 DIAGNOSIS — J3089 Other allergic rhinitis: Secondary | ICD-10-CM | POA: Diagnosis not present

## 2017-10-16 DIAGNOSIS — J301 Allergic rhinitis due to pollen: Secondary | ICD-10-CM | POA: Diagnosis not present

## 2017-10-22 DIAGNOSIS — J3089 Other allergic rhinitis: Secondary | ICD-10-CM | POA: Diagnosis not present

## 2017-10-22 DIAGNOSIS — J3081 Allergic rhinitis due to animal (cat) (dog) hair and dander: Secondary | ICD-10-CM | POA: Diagnosis not present

## 2017-10-22 DIAGNOSIS — J301 Allergic rhinitis due to pollen: Secondary | ICD-10-CM | POA: Diagnosis not present

## 2017-11-06 DIAGNOSIS — J3089 Other allergic rhinitis: Secondary | ICD-10-CM | POA: Diagnosis not present

## 2017-11-06 DIAGNOSIS — J3081 Allergic rhinitis due to animal (cat) (dog) hair and dander: Secondary | ICD-10-CM | POA: Diagnosis not present

## 2017-11-06 DIAGNOSIS — J301 Allergic rhinitis due to pollen: Secondary | ICD-10-CM | POA: Diagnosis not present

## 2017-11-13 DIAGNOSIS — J3081 Allergic rhinitis due to animal (cat) (dog) hair and dander: Secondary | ICD-10-CM | POA: Diagnosis not present

## 2017-11-13 DIAGNOSIS — J3089 Other allergic rhinitis: Secondary | ICD-10-CM | POA: Diagnosis not present

## 2017-11-13 DIAGNOSIS — J301 Allergic rhinitis due to pollen: Secondary | ICD-10-CM | POA: Diagnosis not present

## 2017-11-27 DIAGNOSIS — J301 Allergic rhinitis due to pollen: Secondary | ICD-10-CM | POA: Diagnosis not present

## 2017-11-27 DIAGNOSIS — J3081 Allergic rhinitis due to animal (cat) (dog) hair and dander: Secondary | ICD-10-CM | POA: Diagnosis not present

## 2017-11-27 DIAGNOSIS — J3089 Other allergic rhinitis: Secondary | ICD-10-CM | POA: Diagnosis not present

## 2017-12-03 DIAGNOSIS — E1165 Type 2 diabetes mellitus with hyperglycemia: Secondary | ICD-10-CM | POA: Diagnosis not present

## 2017-12-03 DIAGNOSIS — E559 Vitamin D deficiency, unspecified: Secondary | ICD-10-CM | POA: Diagnosis not present

## 2017-12-04 DIAGNOSIS — J3081 Allergic rhinitis due to animal (cat) (dog) hair and dander: Secondary | ICD-10-CM | POA: Diagnosis not present

## 2017-12-04 DIAGNOSIS — J301 Allergic rhinitis due to pollen: Secondary | ICD-10-CM | POA: Diagnosis not present

## 2017-12-04 DIAGNOSIS — J3089 Other allergic rhinitis: Secondary | ICD-10-CM | POA: Diagnosis not present

## 2017-12-11 DIAGNOSIS — J3089 Other allergic rhinitis: Secondary | ICD-10-CM | POA: Diagnosis not present

## 2017-12-11 DIAGNOSIS — J301 Allergic rhinitis due to pollen: Secondary | ICD-10-CM | POA: Diagnosis not present

## 2017-12-11 DIAGNOSIS — J3081 Allergic rhinitis due to animal (cat) (dog) hair and dander: Secondary | ICD-10-CM | POA: Diagnosis not present

## 2017-12-18 DIAGNOSIS — J301 Allergic rhinitis due to pollen: Secondary | ICD-10-CM | POA: Diagnosis not present

## 2017-12-18 DIAGNOSIS — J3089 Other allergic rhinitis: Secondary | ICD-10-CM | POA: Diagnosis not present

## 2017-12-18 DIAGNOSIS — J3081 Allergic rhinitis due to animal (cat) (dog) hair and dander: Secondary | ICD-10-CM | POA: Diagnosis not present

## 2018-01-08 DIAGNOSIS — J301 Allergic rhinitis due to pollen: Secondary | ICD-10-CM | POA: Diagnosis not present

## 2018-01-08 DIAGNOSIS — J3081 Allergic rhinitis due to animal (cat) (dog) hair and dander: Secondary | ICD-10-CM | POA: Diagnosis not present

## 2018-01-08 DIAGNOSIS — J3089 Other allergic rhinitis: Secondary | ICD-10-CM | POA: Diagnosis not present

## 2018-01-15 DIAGNOSIS — J301 Allergic rhinitis due to pollen: Secondary | ICD-10-CM | POA: Diagnosis not present

## 2018-01-15 DIAGNOSIS — J3081 Allergic rhinitis due to animal (cat) (dog) hair and dander: Secondary | ICD-10-CM | POA: Diagnosis not present

## 2018-01-15 DIAGNOSIS — J3089 Other allergic rhinitis: Secondary | ICD-10-CM | POA: Diagnosis not present

## 2018-01-22 DIAGNOSIS — J301 Allergic rhinitis due to pollen: Secondary | ICD-10-CM | POA: Diagnosis not present

## 2018-01-22 DIAGNOSIS — J3089 Other allergic rhinitis: Secondary | ICD-10-CM | POA: Diagnosis not present

## 2018-01-22 DIAGNOSIS — J3081 Allergic rhinitis due to animal (cat) (dog) hair and dander: Secondary | ICD-10-CM | POA: Diagnosis not present

## 2018-02-09 DIAGNOSIS — J3081 Allergic rhinitis due to animal (cat) (dog) hair and dander: Secondary | ICD-10-CM | POA: Diagnosis not present

## 2018-02-09 DIAGNOSIS — J301 Allergic rhinitis due to pollen: Secondary | ICD-10-CM | POA: Diagnosis not present

## 2018-02-09 DIAGNOSIS — M6780 Other specified disorders of synovium and tendon, unspecified site: Secondary | ICD-10-CM | POA: Diagnosis not present

## 2018-02-09 DIAGNOSIS — J3089 Other allergic rhinitis: Secondary | ICD-10-CM | POA: Diagnosis not present

## 2018-02-09 DIAGNOSIS — M1990 Unspecified osteoarthritis, unspecified site: Secondary | ICD-10-CM | POA: Diagnosis not present

## 2018-02-09 DIAGNOSIS — H6122 Impacted cerumen, left ear: Secondary | ICD-10-CM | POA: Diagnosis not present

## 2018-02-16 DIAGNOSIS — J3089 Other allergic rhinitis: Secondary | ICD-10-CM | POA: Diagnosis not present

## 2018-02-16 DIAGNOSIS — J3081 Allergic rhinitis due to animal (cat) (dog) hair and dander: Secondary | ICD-10-CM | POA: Diagnosis not present

## 2018-02-16 DIAGNOSIS — J301 Allergic rhinitis due to pollen: Secondary | ICD-10-CM | POA: Diagnosis not present

## 2018-02-23 DIAGNOSIS — J3089 Other allergic rhinitis: Secondary | ICD-10-CM | POA: Diagnosis not present

## 2018-02-23 DIAGNOSIS — J3081 Allergic rhinitis due to animal (cat) (dog) hair and dander: Secondary | ICD-10-CM | POA: Diagnosis not present

## 2018-02-23 DIAGNOSIS — J301 Allergic rhinitis due to pollen: Secondary | ICD-10-CM | POA: Diagnosis not present

## 2018-03-09 DIAGNOSIS — J301 Allergic rhinitis due to pollen: Secondary | ICD-10-CM | POA: Diagnosis not present

## 2018-03-09 DIAGNOSIS — J3081 Allergic rhinitis due to animal (cat) (dog) hair and dander: Secondary | ICD-10-CM | POA: Diagnosis not present

## 2018-03-09 DIAGNOSIS — J3089 Other allergic rhinitis: Secondary | ICD-10-CM | POA: Diagnosis not present

## 2018-03-17 DIAGNOSIS — R3915 Urgency of urination: Secondary | ICD-10-CM | POA: Diagnosis not present

## 2018-03-17 DIAGNOSIS — R399 Unspecified symptoms and signs involving the genitourinary system: Secondary | ICD-10-CM | POA: Diagnosis not present

## 2018-03-17 DIAGNOSIS — N3000 Acute cystitis without hematuria: Secondary | ICD-10-CM | POA: Diagnosis not present

## 2018-03-17 DIAGNOSIS — R350 Frequency of micturition: Secondary | ICD-10-CM | POA: Diagnosis not present

## 2018-03-17 DIAGNOSIS — R300 Dysuria: Secondary | ICD-10-CM | POA: Diagnosis not present

## 2018-03-19 DIAGNOSIS — M65332 Trigger finger, left middle finger: Secondary | ICD-10-CM | POA: Diagnosis not present

## 2018-03-19 DIAGNOSIS — M65341 Trigger finger, right ring finger: Secondary | ICD-10-CM | POA: Diagnosis not present

## 2018-03-23 DIAGNOSIS — J3089 Other allergic rhinitis: Secondary | ICD-10-CM | POA: Diagnosis not present

## 2018-03-23 DIAGNOSIS — J3081 Allergic rhinitis due to animal (cat) (dog) hair and dander: Secondary | ICD-10-CM | POA: Diagnosis not present

## 2018-03-23 DIAGNOSIS — J301 Allergic rhinitis due to pollen: Secondary | ICD-10-CM | POA: Diagnosis not present

## 2018-04-01 DIAGNOSIS — J301 Allergic rhinitis due to pollen: Secondary | ICD-10-CM | POA: Diagnosis not present

## 2018-04-01 DIAGNOSIS — J3081 Allergic rhinitis due to animal (cat) (dog) hair and dander: Secondary | ICD-10-CM | POA: Diagnosis not present

## 2018-04-01 DIAGNOSIS — J3089 Other allergic rhinitis: Secondary | ICD-10-CM | POA: Diagnosis not present

## 2018-04-08 DIAGNOSIS — M65341 Trigger finger, right ring finger: Secondary | ICD-10-CM | POA: Diagnosis not present

## 2018-04-13 DIAGNOSIS — J3089 Other allergic rhinitis: Secondary | ICD-10-CM | POA: Diagnosis not present

## 2018-04-13 DIAGNOSIS — J301 Allergic rhinitis due to pollen: Secondary | ICD-10-CM | POA: Diagnosis not present

## 2018-04-13 DIAGNOSIS — J3081 Allergic rhinitis due to animal (cat) (dog) hair and dander: Secondary | ICD-10-CM | POA: Diagnosis not present

## 2018-05-13 DIAGNOSIS — M65332 Trigger finger, left middle finger: Secondary | ICD-10-CM | POA: Diagnosis not present

## 2018-06-24 DIAGNOSIS — M65332 Trigger finger, left middle finger: Secondary | ICD-10-CM | POA: Diagnosis not present

## 2018-06-24 DIAGNOSIS — S56011A Strain of flexor muscle, fascia and tendon of right thumb at forearm level, initial encounter: Secondary | ICD-10-CM | POA: Diagnosis not present

## 2018-06-24 DIAGNOSIS — Z5189 Encounter for other specified aftercare: Secondary | ICD-10-CM | POA: Diagnosis not present

## 2018-07-06 DIAGNOSIS — M79631 Pain in right forearm: Secondary | ICD-10-CM | POA: Diagnosis not present

## 2018-07-13 DIAGNOSIS — M25521 Pain in right elbow: Secondary | ICD-10-CM | POA: Diagnosis not present

## 2018-07-13 DIAGNOSIS — M79601 Pain in right arm: Secondary | ICD-10-CM | POA: Diagnosis not present

## 2018-07-18 IMAGING — DX DG CERVICAL SPINE COMPLETE 4+V
5 series · 5 of 5 positions shown · non-contrast
Comparison: No recent prior .

CLINICAL DATA: Headache.

EXAM:
CERVICAL SPINE - COMPLETE 4+ VIEW

[dg cervical spine complete (1 of 5)]
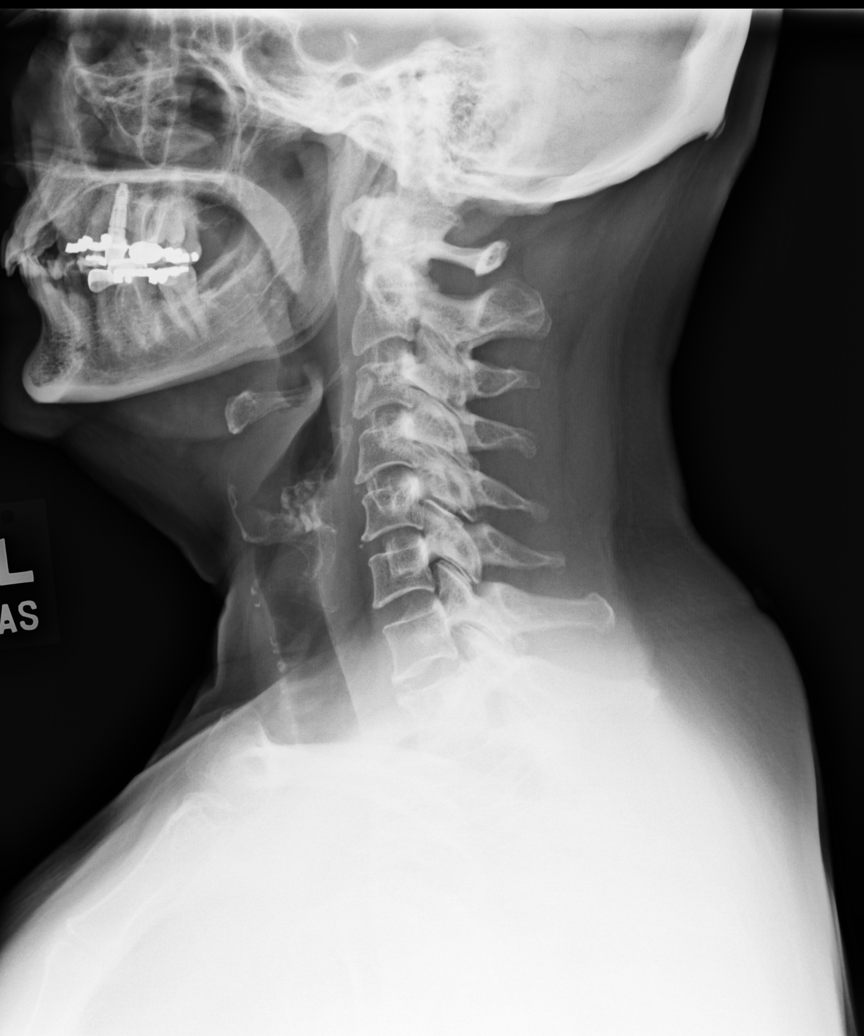

[dg cervical spine complete (2 of 5)]
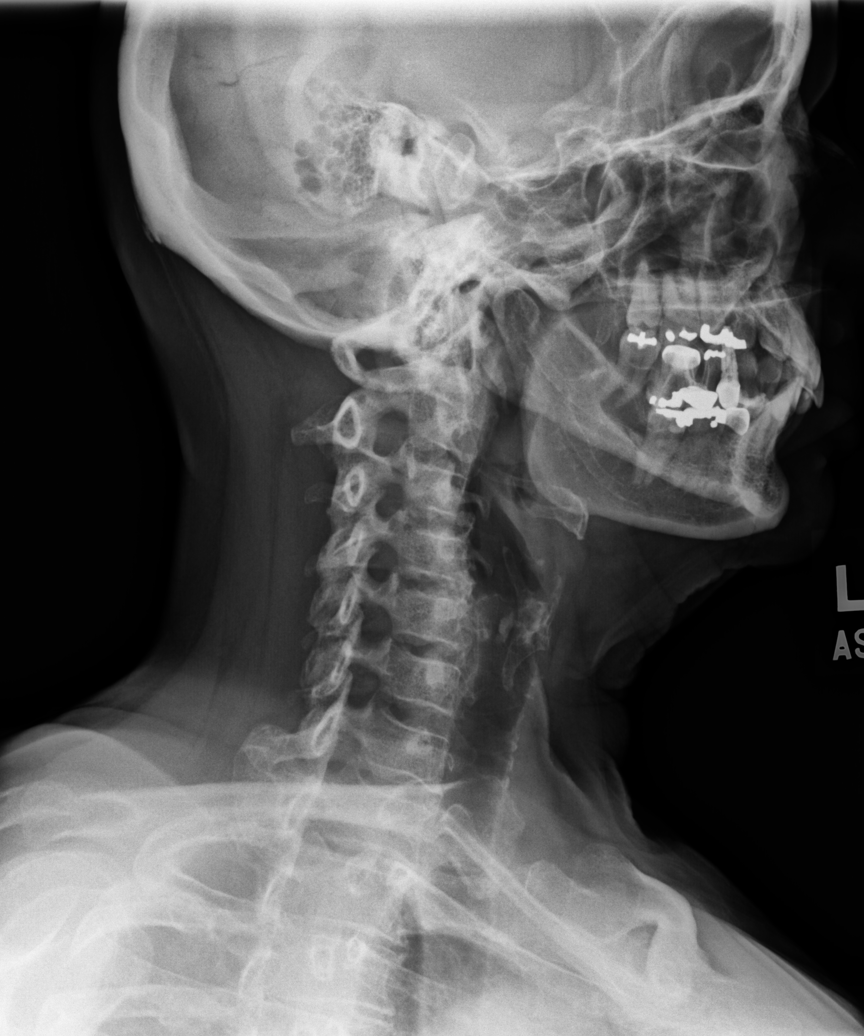

[dg cervical spine complete (3 of 5)]
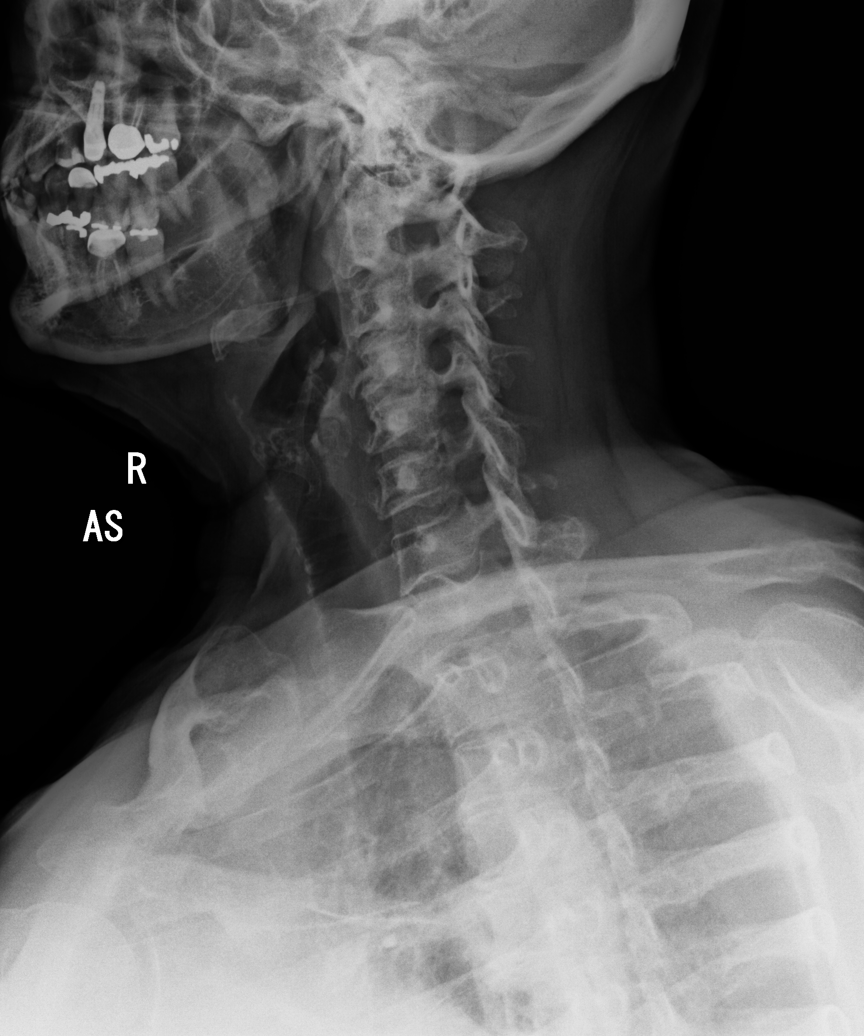

[dg cervical spine complete (4 of 5)]
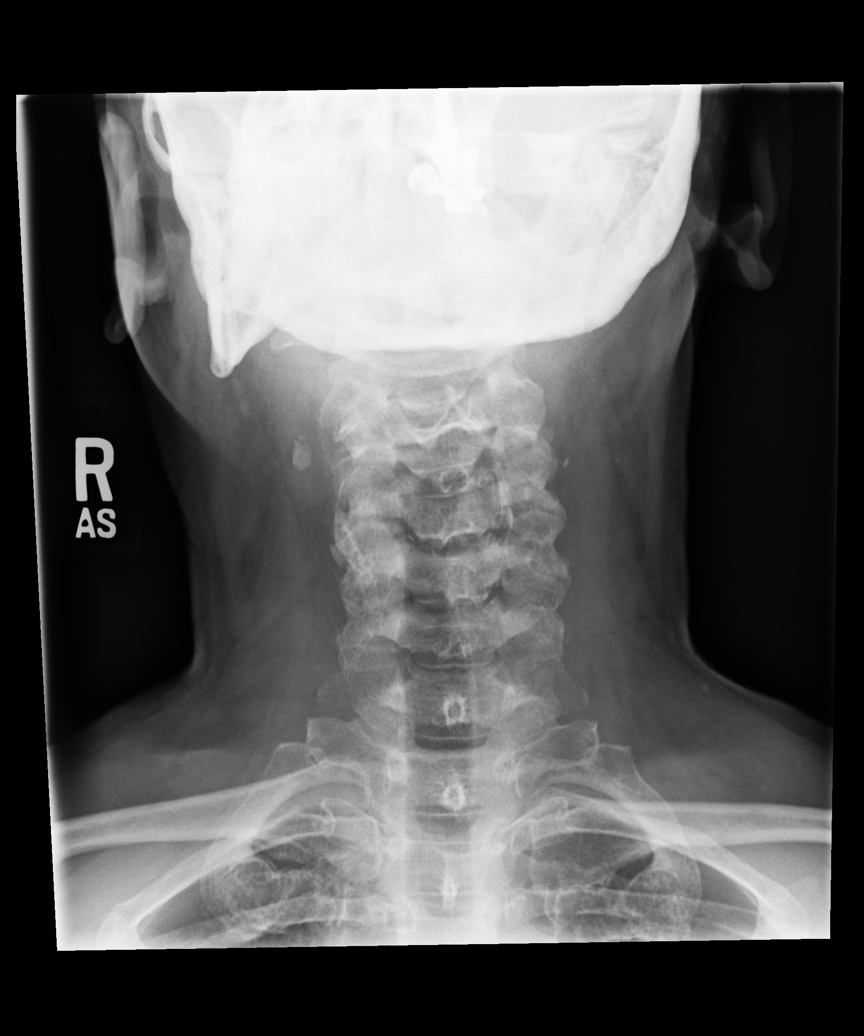

[dg cervical spine complete (5 of 5)]
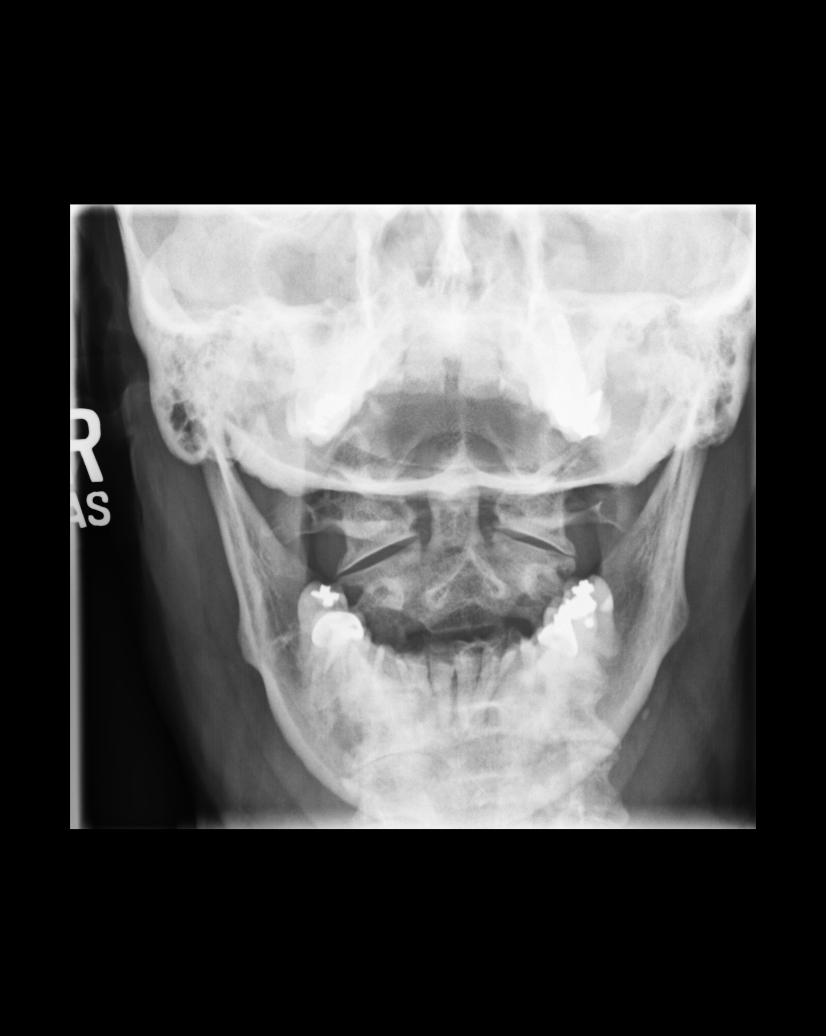

[5 of 5 positions shown; findings below may reference images not displayed]

FINDINGS: No acute bony abnormality identified. Normal alignment. Mild diffuse
degenerative change. Bilateral carotid vascular calcification.
Biapical pleural thickening noted consistent scarring .
IMPRESSION: 1. No acute or focal bony abnormality identified. Mild diffuse
degenerative change.

2. Bilateral carotid vascular disease.

## 2018-08-24 DIAGNOSIS — J301 Allergic rhinitis due to pollen: Secondary | ICD-10-CM | POA: Diagnosis not present

## 2018-08-24 DIAGNOSIS — J3089 Other allergic rhinitis: Secondary | ICD-10-CM | POA: Diagnosis not present

## 2018-08-24 DIAGNOSIS — J3081 Allergic rhinitis due to animal (cat) (dog) hair and dander: Secondary | ICD-10-CM | POA: Diagnosis not present

## 2018-08-30 DIAGNOSIS — N3000 Acute cystitis without hematuria: Secondary | ICD-10-CM | POA: Diagnosis not present

## 2018-08-30 DIAGNOSIS — E1122 Type 2 diabetes mellitus with diabetic chronic kidney disease: Secondary | ICD-10-CM | POA: Diagnosis not present

## 2018-08-30 DIAGNOSIS — N182 Chronic kidney disease, stage 2 (mild): Secondary | ICD-10-CM | POA: Diagnosis not present

## 2018-09-10 DIAGNOSIS — J309 Allergic rhinitis, unspecified: Secondary | ICD-10-CM | POA: Diagnosis not present

## 2018-09-10 DIAGNOSIS — J069 Acute upper respiratory infection, unspecified: Secondary | ICD-10-CM | POA: Diagnosis not present

## 2018-10-21 DIAGNOSIS — Z0000 Encounter for general adult medical examination without abnormal findings: Secondary | ICD-10-CM | POA: Diagnosis not present

## 2018-10-21 DIAGNOSIS — Z79899 Other long term (current) drug therapy: Secondary | ICD-10-CM | POA: Diagnosis not present

## 2018-10-21 DIAGNOSIS — I10 Essential (primary) hypertension: Secondary | ICD-10-CM | POA: Diagnosis not present

## 2018-10-21 DIAGNOSIS — E7800 Pure hypercholesterolemia, unspecified: Secondary | ICD-10-CM | POA: Diagnosis not present

## 2018-10-21 DIAGNOSIS — J301 Allergic rhinitis due to pollen: Secondary | ICD-10-CM | POA: Diagnosis not present

## 2018-10-21 DIAGNOSIS — J3089 Other allergic rhinitis: Secondary | ICD-10-CM | POA: Diagnosis not present

## 2018-10-21 DIAGNOSIS — Z1211 Encounter for screening for malignant neoplasm of colon: Secondary | ICD-10-CM | POA: Diagnosis not present

## 2018-10-21 DIAGNOSIS — E1122 Type 2 diabetes mellitus with diabetic chronic kidney disease: Secondary | ICD-10-CM | POA: Diagnosis not present

## 2018-10-21 DIAGNOSIS — J3081 Allergic rhinitis due to animal (cat) (dog) hair and dander: Secondary | ICD-10-CM | POA: Diagnosis not present

## 2018-10-21 DIAGNOSIS — E559 Vitamin D deficiency, unspecified: Secondary | ICD-10-CM | POA: Diagnosis not present

## 2018-10-21 DIAGNOSIS — N182 Chronic kidney disease, stage 2 (mild): Secondary | ICD-10-CM | POA: Diagnosis not present

## 2018-12-03 DIAGNOSIS — J301 Allergic rhinitis due to pollen: Secondary | ICD-10-CM | POA: Diagnosis not present

## 2018-12-03 DIAGNOSIS — J3081 Allergic rhinitis due to animal (cat) (dog) hair and dander: Secondary | ICD-10-CM | POA: Diagnosis not present

## 2018-12-03 DIAGNOSIS — J3089 Other allergic rhinitis: Secondary | ICD-10-CM | POA: Diagnosis not present

## 2018-12-24 DIAGNOSIS — J3081 Allergic rhinitis due to animal (cat) (dog) hair and dander: Secondary | ICD-10-CM | POA: Diagnosis not present

## 2018-12-24 DIAGNOSIS — J301 Allergic rhinitis due to pollen: Secondary | ICD-10-CM | POA: Diagnosis not present

## 2018-12-24 DIAGNOSIS — J3089 Other allergic rhinitis: Secondary | ICD-10-CM | POA: Diagnosis not present

## 2018-12-31 DIAGNOSIS — J3081 Allergic rhinitis due to animal (cat) (dog) hair and dander: Secondary | ICD-10-CM | POA: Diagnosis not present

## 2018-12-31 DIAGNOSIS — J301 Allergic rhinitis due to pollen: Secondary | ICD-10-CM | POA: Diagnosis not present

## 2018-12-31 DIAGNOSIS — J3089 Other allergic rhinitis: Secondary | ICD-10-CM | POA: Diagnosis not present

## 2019-01-07 DIAGNOSIS — J3089 Other allergic rhinitis: Secondary | ICD-10-CM | POA: Diagnosis not present

## 2019-01-07 DIAGNOSIS — J301 Allergic rhinitis due to pollen: Secondary | ICD-10-CM | POA: Diagnosis not present

## 2019-01-07 DIAGNOSIS — J3081 Allergic rhinitis due to animal (cat) (dog) hair and dander: Secondary | ICD-10-CM | POA: Diagnosis not present

## 2019-01-14 DIAGNOSIS — H04122 Dry eye syndrome of left lacrimal gland: Secondary | ICD-10-CM | POA: Diagnosis not present

## 2019-01-14 DIAGNOSIS — Z0100 Encounter for examination of eyes and vision without abnormal findings: Secondary | ICD-10-CM | POA: Diagnosis not present

## 2019-01-14 DIAGNOSIS — H25011 Cortical age-related cataract, right eye: Secondary | ICD-10-CM | POA: Diagnosis not present

## 2019-01-14 DIAGNOSIS — H259 Unspecified age-related cataract: Secondary | ICD-10-CM | POA: Diagnosis not present

## 2019-01-14 DIAGNOSIS — J301 Allergic rhinitis due to pollen: Secondary | ICD-10-CM | POA: Diagnosis not present

## 2019-01-14 DIAGNOSIS — Z135 Encounter for screening for eye and ear disorders: Secondary | ICD-10-CM | POA: Diagnosis not present

## 2019-01-14 DIAGNOSIS — H04123 Dry eye syndrome of bilateral lacrimal glands: Secondary | ICD-10-CM | POA: Diagnosis not present

## 2019-01-14 DIAGNOSIS — J3089 Other allergic rhinitis: Secondary | ICD-10-CM | POA: Diagnosis not present

## 2019-01-14 DIAGNOSIS — J3081 Allergic rhinitis due to animal (cat) (dog) hair and dander: Secondary | ICD-10-CM | POA: Diagnosis not present

## 2019-01-21 DIAGNOSIS — J301 Allergic rhinitis due to pollen: Secondary | ICD-10-CM | POA: Diagnosis not present

## 2019-01-21 DIAGNOSIS — J3081 Allergic rhinitis due to animal (cat) (dog) hair and dander: Secondary | ICD-10-CM | POA: Diagnosis not present

## 2019-01-21 DIAGNOSIS — J3089 Other allergic rhinitis: Secondary | ICD-10-CM | POA: Diagnosis not present

## 2019-01-28 DIAGNOSIS — J301 Allergic rhinitis due to pollen: Secondary | ICD-10-CM | POA: Diagnosis not present

## 2019-01-28 DIAGNOSIS — J3081 Allergic rhinitis due to animal (cat) (dog) hair and dander: Secondary | ICD-10-CM | POA: Diagnosis not present

## 2019-01-28 DIAGNOSIS — J3089 Other allergic rhinitis: Secondary | ICD-10-CM | POA: Diagnosis not present

## 2019-02-03 DIAGNOSIS — J3081 Allergic rhinitis due to animal (cat) (dog) hair and dander: Secondary | ICD-10-CM | POA: Diagnosis not present

## 2019-02-03 DIAGNOSIS — H52209 Unspecified astigmatism, unspecified eye: Secondary | ICD-10-CM | POA: Diagnosis not present

## 2019-02-03 DIAGNOSIS — J3089 Other allergic rhinitis: Secondary | ICD-10-CM | POA: Diagnosis not present

## 2019-02-03 DIAGNOSIS — J301 Allergic rhinitis due to pollen: Secondary | ICD-10-CM | POA: Diagnosis not present

## 2019-02-03 DIAGNOSIS — H5203 Hypermetropia, bilateral: Secondary | ICD-10-CM | POA: Diagnosis not present

## 2019-02-18 DIAGNOSIS — J3089 Other allergic rhinitis: Secondary | ICD-10-CM | POA: Diagnosis not present

## 2019-02-18 DIAGNOSIS — J301 Allergic rhinitis due to pollen: Secondary | ICD-10-CM | POA: Diagnosis not present

## 2019-02-18 DIAGNOSIS — J3081 Allergic rhinitis due to animal (cat) (dog) hair and dander: Secondary | ICD-10-CM | POA: Diagnosis not present

## 2019-02-24 DIAGNOSIS — J3081 Allergic rhinitis due to animal (cat) (dog) hair and dander: Secondary | ICD-10-CM | POA: Diagnosis not present

## 2019-02-24 DIAGNOSIS — J3089 Other allergic rhinitis: Secondary | ICD-10-CM | POA: Diagnosis not present

## 2019-02-24 DIAGNOSIS — J301 Allergic rhinitis due to pollen: Secondary | ICD-10-CM | POA: Diagnosis not present

## 2019-03-03 DIAGNOSIS — J3089 Other allergic rhinitis: Secondary | ICD-10-CM | POA: Diagnosis not present

## 2019-03-03 DIAGNOSIS — J301 Allergic rhinitis due to pollen: Secondary | ICD-10-CM | POA: Diagnosis not present

## 2019-03-03 DIAGNOSIS — J3081 Allergic rhinitis due to animal (cat) (dog) hair and dander: Secondary | ICD-10-CM | POA: Diagnosis not present

## 2019-03-18 DIAGNOSIS — J3089 Other allergic rhinitis: Secondary | ICD-10-CM | POA: Diagnosis not present

## 2019-03-18 DIAGNOSIS — J3081 Allergic rhinitis due to animal (cat) (dog) hair and dander: Secondary | ICD-10-CM | POA: Diagnosis not present

## 2019-03-18 DIAGNOSIS — J301 Allergic rhinitis due to pollen: Secondary | ICD-10-CM | POA: Diagnosis not present

## 2019-03-25 DIAGNOSIS — J301 Allergic rhinitis due to pollen: Secondary | ICD-10-CM | POA: Diagnosis not present

## 2019-03-25 DIAGNOSIS — J3089 Other allergic rhinitis: Secondary | ICD-10-CM | POA: Diagnosis not present

## 2019-03-25 DIAGNOSIS — J3081 Allergic rhinitis due to animal (cat) (dog) hair and dander: Secondary | ICD-10-CM | POA: Diagnosis not present

## 2019-04-01 DIAGNOSIS — J3081 Allergic rhinitis due to animal (cat) (dog) hair and dander: Secondary | ICD-10-CM | POA: Diagnosis not present

## 2019-04-01 DIAGNOSIS — J301 Allergic rhinitis due to pollen: Secondary | ICD-10-CM | POA: Diagnosis not present

## 2019-04-01 DIAGNOSIS — J3089 Other allergic rhinitis: Secondary | ICD-10-CM | POA: Diagnosis not present

## 2019-04-08 DIAGNOSIS — J301 Allergic rhinitis due to pollen: Secondary | ICD-10-CM | POA: Diagnosis not present

## 2019-04-08 DIAGNOSIS — J3081 Allergic rhinitis due to animal (cat) (dog) hair and dander: Secondary | ICD-10-CM | POA: Diagnosis not present

## 2019-04-08 DIAGNOSIS — J3089 Other allergic rhinitis: Secondary | ICD-10-CM | POA: Diagnosis not present

## 2019-04-22 DIAGNOSIS — J3089 Other allergic rhinitis: Secondary | ICD-10-CM | POA: Diagnosis not present

## 2019-04-22 DIAGNOSIS — J301 Allergic rhinitis due to pollen: Secondary | ICD-10-CM | POA: Diagnosis not present

## 2019-04-22 DIAGNOSIS — J3081 Allergic rhinitis due to animal (cat) (dog) hair and dander: Secondary | ICD-10-CM | POA: Diagnosis not present

## 2019-04-29 DIAGNOSIS — J3089 Other allergic rhinitis: Secondary | ICD-10-CM | POA: Diagnosis not present

## 2019-04-29 DIAGNOSIS — J301 Allergic rhinitis due to pollen: Secondary | ICD-10-CM | POA: Diagnosis not present

## 2019-04-29 DIAGNOSIS — J3081 Allergic rhinitis due to animal (cat) (dog) hair and dander: Secondary | ICD-10-CM | POA: Diagnosis not present

## 2019-05-05 DIAGNOSIS — J301 Allergic rhinitis due to pollen: Secondary | ICD-10-CM | POA: Diagnosis not present

## 2019-05-05 DIAGNOSIS — J3081 Allergic rhinitis due to animal (cat) (dog) hair and dander: Secondary | ICD-10-CM | POA: Diagnosis not present

## 2019-05-05 DIAGNOSIS — J3089 Other allergic rhinitis: Secondary | ICD-10-CM | POA: Diagnosis not present

## 2019-05-20 DIAGNOSIS — J301 Allergic rhinitis due to pollen: Secondary | ICD-10-CM | POA: Diagnosis not present

## 2019-05-20 DIAGNOSIS — J3081 Allergic rhinitis due to animal (cat) (dog) hair and dander: Secondary | ICD-10-CM | POA: Diagnosis not present

## 2019-05-20 DIAGNOSIS — J3089 Other allergic rhinitis: Secondary | ICD-10-CM | POA: Diagnosis not present

## 2019-06-10 DIAGNOSIS — J301 Allergic rhinitis due to pollen: Secondary | ICD-10-CM | POA: Diagnosis not present

## 2019-06-10 DIAGNOSIS — J3081 Allergic rhinitis due to animal (cat) (dog) hair and dander: Secondary | ICD-10-CM | POA: Diagnosis not present

## 2019-06-10 DIAGNOSIS — J3089 Other allergic rhinitis: Secondary | ICD-10-CM | POA: Diagnosis not present

## 2019-06-17 DIAGNOSIS — J301 Allergic rhinitis due to pollen: Secondary | ICD-10-CM | POA: Diagnosis not present

## 2019-06-17 DIAGNOSIS — J3089 Other allergic rhinitis: Secondary | ICD-10-CM | POA: Diagnosis not present

## 2019-06-17 DIAGNOSIS — J3081 Allergic rhinitis due to animal (cat) (dog) hair and dander: Secondary | ICD-10-CM | POA: Diagnosis not present

## 2019-06-24 DIAGNOSIS — J3089 Other allergic rhinitis: Secondary | ICD-10-CM | POA: Diagnosis not present

## 2019-06-24 DIAGNOSIS — J301 Allergic rhinitis due to pollen: Secondary | ICD-10-CM | POA: Diagnosis not present

## 2019-06-24 DIAGNOSIS — J3081 Allergic rhinitis due to animal (cat) (dog) hair and dander: Secondary | ICD-10-CM | POA: Diagnosis not present

## 2019-07-08 DIAGNOSIS — J3089 Other allergic rhinitis: Secondary | ICD-10-CM | POA: Diagnosis not present

## 2019-07-08 DIAGNOSIS — J3081 Allergic rhinitis due to animal (cat) (dog) hair and dander: Secondary | ICD-10-CM | POA: Diagnosis not present

## 2019-07-08 DIAGNOSIS — J301 Allergic rhinitis due to pollen: Secondary | ICD-10-CM | POA: Diagnosis not present

## 2019-07-15 DIAGNOSIS — J3081 Allergic rhinitis due to animal (cat) (dog) hair and dander: Secondary | ICD-10-CM | POA: Diagnosis not present

## 2019-07-15 DIAGNOSIS — J301 Allergic rhinitis due to pollen: Secondary | ICD-10-CM | POA: Diagnosis not present

## 2019-07-15 DIAGNOSIS — J3089 Other allergic rhinitis: Secondary | ICD-10-CM | POA: Diagnosis not present

## 2019-07-22 DIAGNOSIS — J3089 Other allergic rhinitis: Secondary | ICD-10-CM | POA: Diagnosis not present

## 2019-07-22 DIAGNOSIS — J3081 Allergic rhinitis due to animal (cat) (dog) hair and dander: Secondary | ICD-10-CM | POA: Diagnosis not present

## 2019-07-22 DIAGNOSIS — J301 Allergic rhinitis due to pollen: Secondary | ICD-10-CM | POA: Diagnosis not present

## 2019-08-02 DIAGNOSIS — J019 Acute sinusitis, unspecified: Secondary | ICD-10-CM | POA: Diagnosis not present

## 2019-09-12 DIAGNOSIS — J301 Allergic rhinitis due to pollen: Secondary | ICD-10-CM | POA: Diagnosis not present

## 2019-09-12 DIAGNOSIS — J3081 Allergic rhinitis due to animal (cat) (dog) hair and dander: Secondary | ICD-10-CM | POA: Diagnosis not present

## 2019-09-12 DIAGNOSIS — J3089 Other allergic rhinitis: Secondary | ICD-10-CM | POA: Diagnosis not present

## 2019-09-16 DIAGNOSIS — J3089 Other allergic rhinitis: Secondary | ICD-10-CM | POA: Diagnosis not present

## 2019-09-16 DIAGNOSIS — J301 Allergic rhinitis due to pollen: Secondary | ICD-10-CM | POA: Diagnosis not present

## 2019-09-16 DIAGNOSIS — J3081 Allergic rhinitis due to animal (cat) (dog) hair and dander: Secondary | ICD-10-CM | POA: Diagnosis not present

## 2019-09-23 DIAGNOSIS — J3081 Allergic rhinitis due to animal (cat) (dog) hair and dander: Secondary | ICD-10-CM | POA: Diagnosis not present

## 2019-09-23 DIAGNOSIS — J301 Allergic rhinitis due to pollen: Secondary | ICD-10-CM | POA: Diagnosis not present

## 2019-09-23 DIAGNOSIS — J3089 Other allergic rhinitis: Secondary | ICD-10-CM | POA: Diagnosis not present

## 2019-10-07 DIAGNOSIS — J3089 Other allergic rhinitis: Secondary | ICD-10-CM | POA: Diagnosis not present

## 2019-10-07 DIAGNOSIS — J301 Allergic rhinitis due to pollen: Secondary | ICD-10-CM | POA: Diagnosis not present

## 2019-10-07 DIAGNOSIS — J3081 Allergic rhinitis due to animal (cat) (dog) hair and dander: Secondary | ICD-10-CM | POA: Diagnosis not present

## 2019-10-14 DIAGNOSIS — J301 Allergic rhinitis due to pollen: Secondary | ICD-10-CM | POA: Diagnosis not present

## 2019-10-14 DIAGNOSIS — J3081 Allergic rhinitis due to animal (cat) (dog) hair and dander: Secondary | ICD-10-CM | POA: Diagnosis not present

## 2019-10-14 DIAGNOSIS — J3089 Other allergic rhinitis: Secondary | ICD-10-CM | POA: Diagnosis not present

## 2019-10-21 DIAGNOSIS — J301 Allergic rhinitis due to pollen: Secondary | ICD-10-CM | POA: Diagnosis not present

## 2019-10-21 DIAGNOSIS — J3089 Other allergic rhinitis: Secondary | ICD-10-CM | POA: Diagnosis not present

## 2019-10-21 DIAGNOSIS — J3081 Allergic rhinitis due to animal (cat) (dog) hair and dander: Secondary | ICD-10-CM | POA: Diagnosis not present

## 2019-10-28 DIAGNOSIS — J3089 Other allergic rhinitis: Secondary | ICD-10-CM | POA: Diagnosis not present

## 2019-10-28 DIAGNOSIS — J3081 Allergic rhinitis due to animal (cat) (dog) hair and dander: Secondary | ICD-10-CM | POA: Diagnosis not present

## 2019-10-28 DIAGNOSIS — J301 Allergic rhinitis due to pollen: Secondary | ICD-10-CM | POA: Diagnosis not present

## 2019-11-07 ENCOUNTER — Ambulatory Visit: Payer: Medicare HMO | Admitting: Family Medicine

## 2019-11-10 DIAGNOSIS — Z79899 Other long term (current) drug therapy: Secondary | ICD-10-CM | POA: Diagnosis not present

## 2019-11-10 DIAGNOSIS — Z803 Family history of malignant neoplasm of breast: Secondary | ICD-10-CM | POA: Diagnosis not present

## 2019-11-10 DIAGNOSIS — E1122 Type 2 diabetes mellitus with diabetic chronic kidney disease: Secondary | ICD-10-CM | POA: Diagnosis not present

## 2019-11-10 DIAGNOSIS — Z0000 Encounter for general adult medical examination without abnormal findings: Secondary | ICD-10-CM | POA: Diagnosis not present

## 2019-11-10 DIAGNOSIS — E559 Vitamin D deficiency, unspecified: Secondary | ICD-10-CM | POA: Diagnosis not present

## 2019-11-10 DIAGNOSIS — E7800 Pure hypercholesterolemia, unspecified: Secondary | ICD-10-CM | POA: Diagnosis not present

## 2019-11-10 DIAGNOSIS — J3081 Allergic rhinitis due to animal (cat) (dog) hair and dander: Secondary | ICD-10-CM | POA: Diagnosis not present

## 2019-11-10 DIAGNOSIS — Z1211 Encounter for screening for malignant neoplasm of colon: Secondary | ICD-10-CM | POA: Diagnosis not present

## 2019-11-10 DIAGNOSIS — N3281 Overactive bladder: Secondary | ICD-10-CM | POA: Diagnosis not present

## 2019-11-10 DIAGNOSIS — N182 Chronic kidney disease, stage 2 (mild): Secondary | ICD-10-CM | POA: Diagnosis not present

## 2019-11-10 DIAGNOSIS — J3089 Other allergic rhinitis: Secondary | ICD-10-CM | POA: Diagnosis not present

## 2019-11-10 DIAGNOSIS — I779 Disorder of arteries and arterioles, unspecified: Secondary | ICD-10-CM | POA: Diagnosis not present

## 2019-11-10 DIAGNOSIS — I10 Essential (primary) hypertension: Secondary | ICD-10-CM | POA: Diagnosis not present

## 2019-11-10 DIAGNOSIS — J301 Allergic rhinitis due to pollen: Secondary | ICD-10-CM | POA: Diagnosis not present

## 2019-11-10 DIAGNOSIS — D692 Other nonthrombocytopenic purpura: Secondary | ICD-10-CM | POA: Diagnosis not present

## 2019-11-22 DIAGNOSIS — J301 Allergic rhinitis due to pollen: Secondary | ICD-10-CM | POA: Diagnosis not present

## 2019-11-22 DIAGNOSIS — H1045 Other chronic allergic conjunctivitis: Secondary | ICD-10-CM | POA: Diagnosis not present

## 2019-11-22 DIAGNOSIS — J3089 Other allergic rhinitis: Secondary | ICD-10-CM | POA: Diagnosis not present

## 2019-11-22 DIAGNOSIS — J3081 Allergic rhinitis due to animal (cat) (dog) hair and dander: Secondary | ICD-10-CM | POA: Diagnosis not present

## 2019-12-16 DIAGNOSIS — J3089 Other allergic rhinitis: Secondary | ICD-10-CM | POA: Diagnosis not present

## 2019-12-16 DIAGNOSIS — J3081 Allergic rhinitis due to animal (cat) (dog) hair and dander: Secondary | ICD-10-CM | POA: Diagnosis not present

## 2019-12-16 DIAGNOSIS — J301 Allergic rhinitis due to pollen: Secondary | ICD-10-CM | POA: Diagnosis not present

## 2019-12-23 DIAGNOSIS — Z1211 Encounter for screening for malignant neoplasm of colon: Secondary | ICD-10-CM | POA: Diagnosis not present

## 2019-12-23 DIAGNOSIS — J3089 Other allergic rhinitis: Secondary | ICD-10-CM | POA: Diagnosis not present

## 2019-12-23 DIAGNOSIS — J301 Allergic rhinitis due to pollen: Secondary | ICD-10-CM | POA: Diagnosis not present

## 2019-12-23 DIAGNOSIS — J3081 Allergic rhinitis due to animal (cat) (dog) hair and dander: Secondary | ICD-10-CM | POA: Diagnosis not present

## 2020-01-13 DIAGNOSIS — J3089 Other allergic rhinitis: Secondary | ICD-10-CM | POA: Diagnosis not present

## 2020-01-13 DIAGNOSIS — J301 Allergic rhinitis due to pollen: Secondary | ICD-10-CM | POA: Diagnosis not present

## 2020-01-13 DIAGNOSIS — J3081 Allergic rhinitis due to animal (cat) (dog) hair and dander: Secondary | ICD-10-CM | POA: Diagnosis not present

## 2020-01-19 DIAGNOSIS — J3089 Other allergic rhinitis: Secondary | ICD-10-CM | POA: Diagnosis not present

## 2020-01-19 DIAGNOSIS — J301 Allergic rhinitis due to pollen: Secondary | ICD-10-CM | POA: Diagnosis not present

## 2020-01-19 DIAGNOSIS — J3081 Allergic rhinitis due to animal (cat) (dog) hair and dander: Secondary | ICD-10-CM | POA: Diagnosis not present

## 2020-01-26 DIAGNOSIS — J3089 Other allergic rhinitis: Secondary | ICD-10-CM | POA: Diagnosis not present

## 2020-01-26 DIAGNOSIS — J301 Allergic rhinitis due to pollen: Secondary | ICD-10-CM | POA: Diagnosis not present

## 2020-01-26 DIAGNOSIS — J3081 Allergic rhinitis due to animal (cat) (dog) hair and dander: Secondary | ICD-10-CM | POA: Diagnosis not present

## 2020-02-02 DIAGNOSIS — J3089 Other allergic rhinitis: Secondary | ICD-10-CM | POA: Diagnosis not present

## 2020-02-02 DIAGNOSIS — J3081 Allergic rhinitis due to animal (cat) (dog) hair and dander: Secondary | ICD-10-CM | POA: Diagnosis not present

## 2020-02-02 DIAGNOSIS — J301 Allergic rhinitis due to pollen: Secondary | ICD-10-CM | POA: Diagnosis not present

## 2020-02-10 DIAGNOSIS — J301 Allergic rhinitis due to pollen: Secondary | ICD-10-CM | POA: Diagnosis not present

## 2020-02-10 DIAGNOSIS — J3081 Allergic rhinitis due to animal (cat) (dog) hair and dander: Secondary | ICD-10-CM | POA: Diagnosis not present

## 2020-02-10 DIAGNOSIS — J3089 Other allergic rhinitis: Secondary | ICD-10-CM | POA: Diagnosis not present

## 2020-02-16 DIAGNOSIS — J3081 Allergic rhinitis due to animal (cat) (dog) hair and dander: Secondary | ICD-10-CM | POA: Diagnosis not present

## 2020-02-16 DIAGNOSIS — J3089 Other allergic rhinitis: Secondary | ICD-10-CM | POA: Diagnosis not present

## 2020-02-16 DIAGNOSIS — J301 Allergic rhinitis due to pollen: Secondary | ICD-10-CM | POA: Diagnosis not present

## 2020-02-23 DIAGNOSIS — J301 Allergic rhinitis due to pollen: Secondary | ICD-10-CM | POA: Diagnosis not present

## 2020-02-23 DIAGNOSIS — J3081 Allergic rhinitis due to animal (cat) (dog) hair and dander: Secondary | ICD-10-CM | POA: Diagnosis not present

## 2020-02-23 DIAGNOSIS — J3089 Other allergic rhinitis: Secondary | ICD-10-CM | POA: Diagnosis not present

## 2020-02-23 DIAGNOSIS — H1045 Other chronic allergic conjunctivitis: Secondary | ICD-10-CM | POA: Diagnosis not present

## 2020-03-01 DIAGNOSIS — J3081 Allergic rhinitis due to animal (cat) (dog) hair and dander: Secondary | ICD-10-CM | POA: Diagnosis not present

## 2020-03-01 DIAGNOSIS — J3089 Other allergic rhinitis: Secondary | ICD-10-CM | POA: Diagnosis not present

## 2020-03-01 DIAGNOSIS — J301 Allergic rhinitis due to pollen: Secondary | ICD-10-CM | POA: Diagnosis not present

## 2020-03-06 DIAGNOSIS — J3089 Other allergic rhinitis: Secondary | ICD-10-CM | POA: Diagnosis not present

## 2020-03-06 DIAGNOSIS — J3081 Allergic rhinitis due to animal (cat) (dog) hair and dander: Secondary | ICD-10-CM | POA: Diagnosis not present

## 2020-03-06 DIAGNOSIS — J301 Allergic rhinitis due to pollen: Secondary | ICD-10-CM | POA: Diagnosis not present

## 2020-03-09 DIAGNOSIS — J3089 Other allergic rhinitis: Secondary | ICD-10-CM | POA: Diagnosis not present

## 2020-03-09 DIAGNOSIS — J301 Allergic rhinitis due to pollen: Secondary | ICD-10-CM | POA: Diagnosis not present

## 2020-03-09 DIAGNOSIS — J3081 Allergic rhinitis due to animal (cat) (dog) hair and dander: Secondary | ICD-10-CM | POA: Diagnosis not present

## 2020-03-16 DIAGNOSIS — J3081 Allergic rhinitis due to animal (cat) (dog) hair and dander: Secondary | ICD-10-CM | POA: Diagnosis not present

## 2020-03-16 DIAGNOSIS — J3089 Other allergic rhinitis: Secondary | ICD-10-CM | POA: Diagnosis not present

## 2020-03-16 DIAGNOSIS — J301 Allergic rhinitis due to pollen: Secondary | ICD-10-CM | POA: Diagnosis not present

## 2020-03-23 DIAGNOSIS — J301 Allergic rhinitis due to pollen: Secondary | ICD-10-CM | POA: Diagnosis not present

## 2020-03-23 DIAGNOSIS — J3089 Other allergic rhinitis: Secondary | ICD-10-CM | POA: Diagnosis not present

## 2020-03-23 DIAGNOSIS — J3081 Allergic rhinitis due to animal (cat) (dog) hair and dander: Secondary | ICD-10-CM | POA: Diagnosis not present

## 2020-03-30 DIAGNOSIS — J301 Allergic rhinitis due to pollen: Secondary | ICD-10-CM | POA: Diagnosis not present

## 2020-03-30 DIAGNOSIS — J3089 Other allergic rhinitis: Secondary | ICD-10-CM | POA: Diagnosis not present

## 2020-03-30 DIAGNOSIS — J3081 Allergic rhinitis due to animal (cat) (dog) hair and dander: Secondary | ICD-10-CM | POA: Diagnosis not present

## 2020-04-06 DIAGNOSIS — J3089 Other allergic rhinitis: Secondary | ICD-10-CM | POA: Diagnosis not present

## 2020-04-06 DIAGNOSIS — J3081 Allergic rhinitis due to animal (cat) (dog) hair and dander: Secondary | ICD-10-CM | POA: Diagnosis not present

## 2020-04-06 DIAGNOSIS — J301 Allergic rhinitis due to pollen: Secondary | ICD-10-CM | POA: Diagnosis not present

## 2020-04-13 DIAGNOSIS — J3081 Allergic rhinitis due to animal (cat) (dog) hair and dander: Secondary | ICD-10-CM | POA: Diagnosis not present

## 2020-04-13 DIAGNOSIS — J301 Allergic rhinitis due to pollen: Secondary | ICD-10-CM | POA: Diagnosis not present

## 2020-04-13 DIAGNOSIS — J3089 Other allergic rhinitis: Secondary | ICD-10-CM | POA: Diagnosis not present

## 2020-04-27 DIAGNOSIS — J301 Allergic rhinitis due to pollen: Secondary | ICD-10-CM | POA: Diagnosis not present

## 2020-04-27 DIAGNOSIS — J3081 Allergic rhinitis due to animal (cat) (dog) hair and dander: Secondary | ICD-10-CM | POA: Diagnosis not present

## 2020-04-27 DIAGNOSIS — J3089 Other allergic rhinitis: Secondary | ICD-10-CM | POA: Diagnosis not present

## 2020-05-10 DIAGNOSIS — J3081 Allergic rhinitis due to animal (cat) (dog) hair and dander: Secondary | ICD-10-CM | POA: Diagnosis not present

## 2020-05-10 DIAGNOSIS — J3089 Other allergic rhinitis: Secondary | ICD-10-CM | POA: Diagnosis not present

## 2020-05-10 DIAGNOSIS — J301 Allergic rhinitis due to pollen: Secondary | ICD-10-CM | POA: Diagnosis not present

## 2020-05-18 DIAGNOSIS — J3089 Other allergic rhinitis: Secondary | ICD-10-CM | POA: Diagnosis not present

## 2020-05-18 DIAGNOSIS — J301 Allergic rhinitis due to pollen: Secondary | ICD-10-CM | POA: Diagnosis not present

## 2020-05-18 DIAGNOSIS — J3081 Allergic rhinitis due to animal (cat) (dog) hair and dander: Secondary | ICD-10-CM | POA: Diagnosis not present

## 2020-05-24 DIAGNOSIS — J3089 Other allergic rhinitis: Secondary | ICD-10-CM | POA: Diagnosis not present

## 2020-05-24 DIAGNOSIS — J3081 Allergic rhinitis due to animal (cat) (dog) hair and dander: Secondary | ICD-10-CM | POA: Diagnosis not present

## 2020-05-24 DIAGNOSIS — J301 Allergic rhinitis due to pollen: Secondary | ICD-10-CM | POA: Diagnosis not present

## 2020-06-01 DIAGNOSIS — J3089 Other allergic rhinitis: Secondary | ICD-10-CM | POA: Diagnosis not present

## 2020-06-01 DIAGNOSIS — J3081 Allergic rhinitis due to animal (cat) (dog) hair and dander: Secondary | ICD-10-CM | POA: Diagnosis not present

## 2020-06-01 DIAGNOSIS — J301 Allergic rhinitis due to pollen: Secondary | ICD-10-CM | POA: Diagnosis not present

## 2020-06-08 DIAGNOSIS — J3089 Other allergic rhinitis: Secondary | ICD-10-CM | POA: Diagnosis not present

## 2020-06-08 DIAGNOSIS — J301 Allergic rhinitis due to pollen: Secondary | ICD-10-CM | POA: Diagnosis not present

## 2020-06-08 DIAGNOSIS — J3081 Allergic rhinitis due to animal (cat) (dog) hair and dander: Secondary | ICD-10-CM | POA: Diagnosis not present

## 2020-06-15 DIAGNOSIS — J301 Allergic rhinitis due to pollen: Secondary | ICD-10-CM | POA: Diagnosis not present

## 2020-06-15 DIAGNOSIS — J3089 Other allergic rhinitis: Secondary | ICD-10-CM | POA: Diagnosis not present

## 2020-06-15 DIAGNOSIS — J3081 Allergic rhinitis due to animal (cat) (dog) hair and dander: Secondary | ICD-10-CM | POA: Diagnosis not present

## 2020-06-22 DIAGNOSIS — J3081 Allergic rhinitis due to animal (cat) (dog) hair and dander: Secondary | ICD-10-CM | POA: Diagnosis not present

## 2020-06-22 DIAGNOSIS — J301 Allergic rhinitis due to pollen: Secondary | ICD-10-CM | POA: Diagnosis not present

## 2020-06-22 DIAGNOSIS — J3089 Other allergic rhinitis: Secondary | ICD-10-CM | POA: Diagnosis not present

## 2020-07-06 DIAGNOSIS — J3081 Allergic rhinitis due to animal (cat) (dog) hair and dander: Secondary | ICD-10-CM | POA: Diagnosis not present

## 2020-07-06 DIAGNOSIS — J3089 Other allergic rhinitis: Secondary | ICD-10-CM | POA: Diagnosis not present

## 2020-07-06 DIAGNOSIS — J301 Allergic rhinitis due to pollen: Secondary | ICD-10-CM | POA: Diagnosis not present

## 2020-07-13 DIAGNOSIS — J301 Allergic rhinitis due to pollen: Secondary | ICD-10-CM | POA: Diagnosis not present

## 2020-07-13 DIAGNOSIS — J3081 Allergic rhinitis due to animal (cat) (dog) hair and dander: Secondary | ICD-10-CM | POA: Diagnosis not present

## 2020-07-13 DIAGNOSIS — J3089 Other allergic rhinitis: Secondary | ICD-10-CM | POA: Diagnosis not present

## 2020-07-20 DIAGNOSIS — J301 Allergic rhinitis due to pollen: Secondary | ICD-10-CM | POA: Diagnosis not present

## 2020-07-20 DIAGNOSIS — J3081 Allergic rhinitis due to animal (cat) (dog) hair and dander: Secondary | ICD-10-CM | POA: Diagnosis not present

## 2020-07-20 DIAGNOSIS — J3089 Other allergic rhinitis: Secondary | ICD-10-CM | POA: Diagnosis not present

## 2020-08-02 DIAGNOSIS — J3089 Other allergic rhinitis: Secondary | ICD-10-CM | POA: Diagnosis not present

## 2020-08-02 DIAGNOSIS — J3081 Allergic rhinitis due to animal (cat) (dog) hair and dander: Secondary | ICD-10-CM | POA: Diagnosis not present

## 2020-08-02 DIAGNOSIS — J301 Allergic rhinitis due to pollen: Secondary | ICD-10-CM | POA: Diagnosis not present

## 2020-08-31 DIAGNOSIS — J3089 Other allergic rhinitis: Secondary | ICD-10-CM | POA: Diagnosis not present

## 2020-08-31 DIAGNOSIS — J3081 Allergic rhinitis due to animal (cat) (dog) hair and dander: Secondary | ICD-10-CM | POA: Diagnosis not present

## 2020-08-31 DIAGNOSIS — J301 Allergic rhinitis due to pollen: Secondary | ICD-10-CM | POA: Diagnosis not present

## 2020-09-07 DIAGNOSIS — J301 Allergic rhinitis due to pollen: Secondary | ICD-10-CM | POA: Diagnosis not present

## 2020-09-07 DIAGNOSIS — J3089 Other allergic rhinitis: Secondary | ICD-10-CM | POA: Diagnosis not present

## 2020-09-07 DIAGNOSIS — J3081 Allergic rhinitis due to animal (cat) (dog) hair and dander: Secondary | ICD-10-CM | POA: Diagnosis not present

## 2020-09-14 DIAGNOSIS — J3089 Other allergic rhinitis: Secondary | ICD-10-CM | POA: Diagnosis not present

## 2020-09-14 DIAGNOSIS — J301 Allergic rhinitis due to pollen: Secondary | ICD-10-CM | POA: Diagnosis not present

## 2020-09-14 DIAGNOSIS — J3081 Allergic rhinitis due to animal (cat) (dog) hair and dander: Secondary | ICD-10-CM | POA: Diagnosis not present

## 2020-09-21 DIAGNOSIS — J3081 Allergic rhinitis due to animal (cat) (dog) hair and dander: Secondary | ICD-10-CM | POA: Diagnosis not present

## 2020-09-21 DIAGNOSIS — J3089 Other allergic rhinitis: Secondary | ICD-10-CM | POA: Diagnosis not present

## 2020-09-21 DIAGNOSIS — J301 Allergic rhinitis due to pollen: Secondary | ICD-10-CM | POA: Diagnosis not present

## 2020-10-05 DIAGNOSIS — J3089 Other allergic rhinitis: Secondary | ICD-10-CM | POA: Diagnosis not present

## 2020-10-05 DIAGNOSIS — J301 Allergic rhinitis due to pollen: Secondary | ICD-10-CM | POA: Diagnosis not present

## 2020-10-05 DIAGNOSIS — J3081 Allergic rhinitis due to animal (cat) (dog) hair and dander: Secondary | ICD-10-CM | POA: Diagnosis not present

## 2020-10-19 DIAGNOSIS — J3081 Allergic rhinitis due to animal (cat) (dog) hair and dander: Secondary | ICD-10-CM | POA: Diagnosis not present

## 2020-10-19 DIAGNOSIS — J3089 Other allergic rhinitis: Secondary | ICD-10-CM | POA: Diagnosis not present

## 2020-10-19 DIAGNOSIS — J301 Allergic rhinitis due to pollen: Secondary | ICD-10-CM | POA: Diagnosis not present

## 2020-10-26 DIAGNOSIS — J301 Allergic rhinitis due to pollen: Secondary | ICD-10-CM | POA: Diagnosis not present

## 2020-10-26 DIAGNOSIS — J3081 Allergic rhinitis due to animal (cat) (dog) hair and dander: Secondary | ICD-10-CM | POA: Diagnosis not present

## 2020-10-26 DIAGNOSIS — J3089 Other allergic rhinitis: Secondary | ICD-10-CM | POA: Diagnosis not present

## 2020-11-01 DIAGNOSIS — J3089 Other allergic rhinitis: Secondary | ICD-10-CM | POA: Diagnosis not present

## 2020-11-01 DIAGNOSIS — J301 Allergic rhinitis due to pollen: Secondary | ICD-10-CM | POA: Diagnosis not present

## 2020-11-01 DIAGNOSIS — J3081 Allergic rhinitis due to animal (cat) (dog) hair and dander: Secondary | ICD-10-CM | POA: Diagnosis not present

## 2020-11-09 DIAGNOSIS — J3089 Other allergic rhinitis: Secondary | ICD-10-CM | POA: Diagnosis not present

## 2020-11-09 DIAGNOSIS — J301 Allergic rhinitis due to pollen: Secondary | ICD-10-CM | POA: Diagnosis not present

## 2020-11-09 DIAGNOSIS — J3081 Allergic rhinitis due to animal (cat) (dog) hair and dander: Secondary | ICD-10-CM | POA: Diagnosis not present

## 2020-11-30 DIAGNOSIS — J3081 Allergic rhinitis due to animal (cat) (dog) hair and dander: Secondary | ICD-10-CM | POA: Diagnosis not present

## 2020-11-30 DIAGNOSIS — J3089 Other allergic rhinitis: Secondary | ICD-10-CM | POA: Diagnosis not present

## 2020-11-30 DIAGNOSIS — J301 Allergic rhinitis due to pollen: Secondary | ICD-10-CM | POA: Diagnosis not present

## 2020-12-07 DIAGNOSIS — J3081 Allergic rhinitis due to animal (cat) (dog) hair and dander: Secondary | ICD-10-CM | POA: Diagnosis not present

## 2020-12-07 DIAGNOSIS — J301 Allergic rhinitis due to pollen: Secondary | ICD-10-CM | POA: Diagnosis not present

## 2020-12-07 DIAGNOSIS — J3089 Other allergic rhinitis: Secondary | ICD-10-CM | POA: Diagnosis not present

## 2020-12-20 DIAGNOSIS — J301 Allergic rhinitis due to pollen: Secondary | ICD-10-CM | POA: Diagnosis not present

## 2020-12-20 DIAGNOSIS — J3089 Other allergic rhinitis: Secondary | ICD-10-CM | POA: Diagnosis not present

## 2020-12-20 DIAGNOSIS — J3081 Allergic rhinitis due to animal (cat) (dog) hair and dander: Secondary | ICD-10-CM | POA: Diagnosis not present

## 2020-12-26 DIAGNOSIS — J3081 Allergic rhinitis due to animal (cat) (dog) hair and dander: Secondary | ICD-10-CM | POA: Diagnosis not present

## 2020-12-26 DIAGNOSIS — J301 Allergic rhinitis due to pollen: Secondary | ICD-10-CM | POA: Diagnosis not present

## 2020-12-26 DIAGNOSIS — E7800 Pure hypercholesterolemia, unspecified: Secondary | ICD-10-CM | POA: Diagnosis not present

## 2020-12-26 DIAGNOSIS — J3089 Other allergic rhinitis: Secondary | ICD-10-CM | POA: Diagnosis not present

## 2020-12-26 DIAGNOSIS — N182 Chronic kidney disease, stage 2 (mild): Secondary | ICD-10-CM | POA: Diagnosis not present

## 2020-12-26 DIAGNOSIS — Z803 Family history of malignant neoplasm of breast: Secondary | ICD-10-CM | POA: Diagnosis not present

## 2020-12-26 DIAGNOSIS — I10 Essential (primary) hypertension: Secondary | ICD-10-CM | POA: Diagnosis not present

## 2020-12-26 DIAGNOSIS — E1122 Type 2 diabetes mellitus with diabetic chronic kidney disease: Secondary | ICD-10-CM | POA: Diagnosis not present

## 2020-12-26 DIAGNOSIS — I779 Disorder of arteries and arterioles, unspecified: Secondary | ICD-10-CM | POA: Diagnosis not present

## 2020-12-26 DIAGNOSIS — Z79899 Other long term (current) drug therapy: Secondary | ICD-10-CM | POA: Diagnosis not present

## 2020-12-26 DIAGNOSIS — E559 Vitamin D deficiency, unspecified: Secondary | ICD-10-CM | POA: Diagnosis not present

## 2020-12-26 DIAGNOSIS — Z0000 Encounter for general adult medical examination without abnormal findings: Secondary | ICD-10-CM | POA: Diagnosis not present

## 2021-01-11 DIAGNOSIS — J3081 Allergic rhinitis due to animal (cat) (dog) hair and dander: Secondary | ICD-10-CM | POA: Diagnosis not present

## 2021-01-11 DIAGNOSIS — J3089 Other allergic rhinitis: Secondary | ICD-10-CM | POA: Diagnosis not present

## 2021-01-11 DIAGNOSIS — J301 Allergic rhinitis due to pollen: Secondary | ICD-10-CM | POA: Diagnosis not present

## 2021-01-18 DIAGNOSIS — J3089 Other allergic rhinitis: Secondary | ICD-10-CM | POA: Diagnosis not present

## 2021-01-18 DIAGNOSIS — J301 Allergic rhinitis due to pollen: Secondary | ICD-10-CM | POA: Diagnosis not present

## 2021-01-18 DIAGNOSIS — J3081 Allergic rhinitis due to animal (cat) (dog) hair and dander: Secondary | ICD-10-CM | POA: Diagnosis not present

## 2021-02-01 DIAGNOSIS — J301 Allergic rhinitis due to pollen: Secondary | ICD-10-CM | POA: Diagnosis not present

## 2021-02-01 DIAGNOSIS — J3089 Other allergic rhinitis: Secondary | ICD-10-CM | POA: Diagnosis not present

## 2021-02-01 DIAGNOSIS — J3081 Allergic rhinitis due to animal (cat) (dog) hair and dander: Secondary | ICD-10-CM | POA: Diagnosis not present

## 2021-02-08 DIAGNOSIS — J301 Allergic rhinitis due to pollen: Secondary | ICD-10-CM | POA: Diagnosis not present

## 2021-02-08 DIAGNOSIS — J3089 Other allergic rhinitis: Secondary | ICD-10-CM | POA: Diagnosis not present

## 2021-02-08 DIAGNOSIS — J3081 Allergic rhinitis due to animal (cat) (dog) hair and dander: Secondary | ICD-10-CM | POA: Diagnosis not present

## 2021-02-22 DIAGNOSIS — J3081 Allergic rhinitis due to animal (cat) (dog) hair and dander: Secondary | ICD-10-CM | POA: Diagnosis not present

## 2021-02-22 DIAGNOSIS — J301 Allergic rhinitis due to pollen: Secondary | ICD-10-CM | POA: Diagnosis not present

## 2021-02-22 DIAGNOSIS — J3089 Other allergic rhinitis: Secondary | ICD-10-CM | POA: Diagnosis not present

## 2021-03-01 DIAGNOSIS — J301 Allergic rhinitis due to pollen: Secondary | ICD-10-CM | POA: Diagnosis not present

## 2021-03-01 DIAGNOSIS — J3081 Allergic rhinitis due to animal (cat) (dog) hair and dander: Secondary | ICD-10-CM | POA: Diagnosis not present

## 2021-03-01 DIAGNOSIS — J3089 Other allergic rhinitis: Secondary | ICD-10-CM | POA: Diagnosis not present

## 2021-03-08 DIAGNOSIS — J3081 Allergic rhinitis due to animal (cat) (dog) hair and dander: Secondary | ICD-10-CM | POA: Diagnosis not present

## 2021-03-08 DIAGNOSIS — J3089 Other allergic rhinitis: Secondary | ICD-10-CM | POA: Diagnosis not present

## 2021-03-08 DIAGNOSIS — J301 Allergic rhinitis due to pollen: Secondary | ICD-10-CM | POA: Diagnosis not present

## 2021-03-22 DIAGNOSIS — J301 Allergic rhinitis due to pollen: Secondary | ICD-10-CM | POA: Diagnosis not present

## 2021-03-22 DIAGNOSIS — J3081 Allergic rhinitis due to animal (cat) (dog) hair and dander: Secondary | ICD-10-CM | POA: Diagnosis not present

## 2021-03-22 DIAGNOSIS — J3089 Other allergic rhinitis: Secondary | ICD-10-CM | POA: Diagnosis not present

## 2021-03-29 DIAGNOSIS — J3081 Allergic rhinitis due to animal (cat) (dog) hair and dander: Secondary | ICD-10-CM | POA: Diagnosis not present

## 2021-03-29 DIAGNOSIS — J3089 Other allergic rhinitis: Secondary | ICD-10-CM | POA: Diagnosis not present

## 2021-03-29 DIAGNOSIS — J301 Allergic rhinitis due to pollen: Secondary | ICD-10-CM | POA: Diagnosis not present

## 2021-04-05 DIAGNOSIS — J3089 Other allergic rhinitis: Secondary | ICD-10-CM | POA: Diagnosis not present

## 2021-04-05 DIAGNOSIS — J3081 Allergic rhinitis due to animal (cat) (dog) hair and dander: Secondary | ICD-10-CM | POA: Diagnosis not present

## 2021-04-05 DIAGNOSIS — J301 Allergic rhinitis due to pollen: Secondary | ICD-10-CM | POA: Diagnosis not present

## 2021-04-12 DIAGNOSIS — J3081 Allergic rhinitis due to animal (cat) (dog) hair and dander: Secondary | ICD-10-CM | POA: Diagnosis not present

## 2021-04-12 DIAGNOSIS — J301 Allergic rhinitis due to pollen: Secondary | ICD-10-CM | POA: Diagnosis not present

## 2021-04-12 DIAGNOSIS — J3089 Other allergic rhinitis: Secondary | ICD-10-CM | POA: Diagnosis not present

## 2021-04-16 DIAGNOSIS — J3089 Other allergic rhinitis: Secondary | ICD-10-CM | POA: Diagnosis not present

## 2021-04-16 DIAGNOSIS — J3081 Allergic rhinitis due to animal (cat) (dog) hair and dander: Secondary | ICD-10-CM | POA: Diagnosis not present

## 2021-04-16 DIAGNOSIS — J301 Allergic rhinitis due to pollen: Secondary | ICD-10-CM | POA: Diagnosis not present

## 2021-04-23 DIAGNOSIS — J3089 Other allergic rhinitis: Secondary | ICD-10-CM | POA: Diagnosis not present

## 2021-04-23 DIAGNOSIS — J3081 Allergic rhinitis due to animal (cat) (dog) hair and dander: Secondary | ICD-10-CM | POA: Diagnosis not present

## 2021-04-23 DIAGNOSIS — J301 Allergic rhinitis due to pollen: Secondary | ICD-10-CM | POA: Diagnosis not present

## 2021-04-30 DIAGNOSIS — J301 Allergic rhinitis due to pollen: Secondary | ICD-10-CM | POA: Diagnosis not present

## 2021-04-30 DIAGNOSIS — J3081 Allergic rhinitis due to animal (cat) (dog) hair and dander: Secondary | ICD-10-CM | POA: Diagnosis not present

## 2021-04-30 DIAGNOSIS — J3089 Other allergic rhinitis: Secondary | ICD-10-CM | POA: Diagnosis not present

## 2021-05-07 DIAGNOSIS — J3081 Allergic rhinitis due to animal (cat) (dog) hair and dander: Secondary | ICD-10-CM | POA: Diagnosis not present

## 2021-05-07 DIAGNOSIS — J3089 Other allergic rhinitis: Secondary | ICD-10-CM | POA: Diagnosis not present

## 2021-05-07 DIAGNOSIS — J301 Allergic rhinitis due to pollen: Secondary | ICD-10-CM | POA: Diagnosis not present

## 2021-05-14 DIAGNOSIS — J3081 Allergic rhinitis due to animal (cat) (dog) hair and dander: Secondary | ICD-10-CM | POA: Diagnosis not present

## 2021-05-14 DIAGNOSIS — J3089 Other allergic rhinitis: Secondary | ICD-10-CM | POA: Diagnosis not present

## 2021-05-14 DIAGNOSIS — J301 Allergic rhinitis due to pollen: Secondary | ICD-10-CM | POA: Diagnosis not present

## 2021-05-16 DIAGNOSIS — H1045 Other chronic allergic conjunctivitis: Secondary | ICD-10-CM | POA: Diagnosis not present

## 2021-05-16 DIAGNOSIS — J301 Allergic rhinitis due to pollen: Secondary | ICD-10-CM | POA: Diagnosis not present

## 2021-05-16 DIAGNOSIS — E1122 Type 2 diabetes mellitus with diabetic chronic kidney disease: Secondary | ICD-10-CM | POA: Diagnosis not present

## 2021-05-16 DIAGNOSIS — J3089 Other allergic rhinitis: Secondary | ICD-10-CM | POA: Diagnosis not present

## 2021-05-16 DIAGNOSIS — J3081 Allergic rhinitis due to animal (cat) (dog) hair and dander: Secondary | ICD-10-CM | POA: Diagnosis not present

## 2021-05-16 DIAGNOSIS — Z7984 Long term (current) use of oral hypoglycemic drugs: Secondary | ICD-10-CM | POA: Diagnosis not present

## 2021-05-21 DIAGNOSIS — J3081 Allergic rhinitis due to animal (cat) (dog) hair and dander: Secondary | ICD-10-CM | POA: Diagnosis not present

## 2021-05-21 DIAGNOSIS — J3089 Other allergic rhinitis: Secondary | ICD-10-CM | POA: Diagnosis not present

## 2021-05-21 DIAGNOSIS — J301 Allergic rhinitis due to pollen: Secondary | ICD-10-CM | POA: Diagnosis not present

## 2021-05-28 DIAGNOSIS — J3081 Allergic rhinitis due to animal (cat) (dog) hair and dander: Secondary | ICD-10-CM | POA: Diagnosis not present

## 2021-05-28 DIAGNOSIS — J301 Allergic rhinitis due to pollen: Secondary | ICD-10-CM | POA: Diagnosis not present

## 2021-05-28 DIAGNOSIS — J3089 Other allergic rhinitis: Secondary | ICD-10-CM | POA: Diagnosis not present

## 2021-06-04 DIAGNOSIS — J301 Allergic rhinitis due to pollen: Secondary | ICD-10-CM | POA: Diagnosis not present

## 2021-06-04 DIAGNOSIS — J3089 Other allergic rhinitis: Secondary | ICD-10-CM | POA: Diagnosis not present

## 2021-06-04 DIAGNOSIS — J3081 Allergic rhinitis due to animal (cat) (dog) hair and dander: Secondary | ICD-10-CM | POA: Diagnosis not present

## 2021-06-11 DIAGNOSIS — J301 Allergic rhinitis due to pollen: Secondary | ICD-10-CM | POA: Diagnosis not present

## 2021-06-11 DIAGNOSIS — J3089 Other allergic rhinitis: Secondary | ICD-10-CM | POA: Diagnosis not present

## 2021-06-11 DIAGNOSIS — J3081 Allergic rhinitis due to animal (cat) (dog) hair and dander: Secondary | ICD-10-CM | POA: Diagnosis not present

## 2021-06-18 DIAGNOSIS — J3089 Other allergic rhinitis: Secondary | ICD-10-CM | POA: Diagnosis not present

## 2021-06-18 DIAGNOSIS — J301 Allergic rhinitis due to pollen: Secondary | ICD-10-CM | POA: Diagnosis not present

## 2021-06-18 DIAGNOSIS — J3081 Allergic rhinitis due to animal (cat) (dog) hair and dander: Secondary | ICD-10-CM | POA: Diagnosis not present

## 2021-06-25 DIAGNOSIS — J3089 Other allergic rhinitis: Secondary | ICD-10-CM | POA: Diagnosis not present

## 2021-06-25 DIAGNOSIS — J3081 Allergic rhinitis due to animal (cat) (dog) hair and dander: Secondary | ICD-10-CM | POA: Diagnosis not present

## 2021-06-25 DIAGNOSIS — J301 Allergic rhinitis due to pollen: Secondary | ICD-10-CM | POA: Diagnosis not present

## 2021-07-01 DIAGNOSIS — J3089 Other allergic rhinitis: Secondary | ICD-10-CM | POA: Diagnosis not present

## 2021-07-01 DIAGNOSIS — J3081 Allergic rhinitis due to animal (cat) (dog) hair and dander: Secondary | ICD-10-CM | POA: Diagnosis not present

## 2021-07-01 DIAGNOSIS — J301 Allergic rhinitis due to pollen: Secondary | ICD-10-CM | POA: Diagnosis not present

## 2021-07-09 DIAGNOSIS — J3081 Allergic rhinitis due to animal (cat) (dog) hair and dander: Secondary | ICD-10-CM | POA: Diagnosis not present

## 2021-07-09 DIAGNOSIS — J301 Allergic rhinitis due to pollen: Secondary | ICD-10-CM | POA: Diagnosis not present

## 2021-07-09 DIAGNOSIS — J3089 Other allergic rhinitis: Secondary | ICD-10-CM | POA: Diagnosis not present

## 2021-07-23 DIAGNOSIS — J3089 Other allergic rhinitis: Secondary | ICD-10-CM | POA: Diagnosis not present

## 2021-07-23 DIAGNOSIS — J301 Allergic rhinitis due to pollen: Secondary | ICD-10-CM | POA: Diagnosis not present

## 2021-07-23 DIAGNOSIS — J3081 Allergic rhinitis due to animal (cat) (dog) hair and dander: Secondary | ICD-10-CM | POA: Diagnosis not present

## 2021-08-06 DIAGNOSIS — J3089 Other allergic rhinitis: Secondary | ICD-10-CM | POA: Diagnosis not present

## 2021-08-06 DIAGNOSIS — J301 Allergic rhinitis due to pollen: Secondary | ICD-10-CM | POA: Diagnosis not present

## 2021-08-06 DIAGNOSIS — J3081 Allergic rhinitis due to animal (cat) (dog) hair and dander: Secondary | ICD-10-CM | POA: Diagnosis not present

## 2021-08-13 DIAGNOSIS — J3089 Other allergic rhinitis: Secondary | ICD-10-CM | POA: Diagnosis not present

## 2021-08-13 DIAGNOSIS — J301 Allergic rhinitis due to pollen: Secondary | ICD-10-CM | POA: Diagnosis not present

## 2021-08-13 DIAGNOSIS — J3081 Allergic rhinitis due to animal (cat) (dog) hair and dander: Secondary | ICD-10-CM | POA: Diagnosis not present

## 2021-08-20 DIAGNOSIS — J301 Allergic rhinitis due to pollen: Secondary | ICD-10-CM | POA: Diagnosis not present

## 2021-08-20 DIAGNOSIS — J3089 Other allergic rhinitis: Secondary | ICD-10-CM | POA: Diagnosis not present

## 2021-08-20 DIAGNOSIS — J3081 Allergic rhinitis due to animal (cat) (dog) hair and dander: Secondary | ICD-10-CM | POA: Diagnosis not present

## 2021-08-27 DIAGNOSIS — J3081 Allergic rhinitis due to animal (cat) (dog) hair and dander: Secondary | ICD-10-CM | POA: Diagnosis not present

## 2021-08-27 DIAGNOSIS — J301 Allergic rhinitis due to pollen: Secondary | ICD-10-CM | POA: Diagnosis not present

## 2021-08-27 DIAGNOSIS — J3089 Other allergic rhinitis: Secondary | ICD-10-CM | POA: Diagnosis not present

## 2021-08-28 DIAGNOSIS — E1122 Type 2 diabetes mellitus with diabetic chronic kidney disease: Secondary | ICD-10-CM | POA: Diagnosis not present

## 2021-08-28 DIAGNOSIS — N182 Chronic kidney disease, stage 2 (mild): Secondary | ICD-10-CM | POA: Diagnosis not present

## 2021-08-28 DIAGNOSIS — S93401A Sprain of unspecified ligament of right ankle, initial encounter: Secondary | ICD-10-CM | POA: Diagnosis not present

## 2021-08-28 DIAGNOSIS — Z7984 Long term (current) use of oral hypoglycemic drugs: Secondary | ICD-10-CM | POA: Diagnosis not present

## 2021-09-03 DIAGNOSIS — J3081 Allergic rhinitis due to animal (cat) (dog) hair and dander: Secondary | ICD-10-CM | POA: Diagnosis not present

## 2021-09-03 DIAGNOSIS — M13841 Other specified arthritis, right hand: Secondary | ICD-10-CM | POA: Diagnosis not present

## 2021-09-03 DIAGNOSIS — M79601 Pain in right arm: Secondary | ICD-10-CM | POA: Diagnosis not present

## 2021-09-03 DIAGNOSIS — J3089 Other allergic rhinitis: Secondary | ICD-10-CM | POA: Diagnosis not present

## 2021-09-03 DIAGNOSIS — J301 Allergic rhinitis due to pollen: Secondary | ICD-10-CM | POA: Diagnosis not present

## 2021-09-10 DIAGNOSIS — J301 Allergic rhinitis due to pollen: Secondary | ICD-10-CM | POA: Diagnosis not present

## 2021-09-10 DIAGNOSIS — J3081 Allergic rhinitis due to animal (cat) (dog) hair and dander: Secondary | ICD-10-CM | POA: Diagnosis not present

## 2021-09-10 DIAGNOSIS — J3089 Other allergic rhinitis: Secondary | ICD-10-CM | POA: Diagnosis not present

## 2021-09-12 DIAGNOSIS — M79601 Pain in right arm: Secondary | ICD-10-CM | POA: Diagnosis not present

## 2021-09-17 DIAGNOSIS — J3089 Other allergic rhinitis: Secondary | ICD-10-CM | POA: Diagnosis not present

## 2021-09-17 DIAGNOSIS — J301 Allergic rhinitis due to pollen: Secondary | ICD-10-CM | POA: Diagnosis not present

## 2021-09-17 DIAGNOSIS — J3081 Allergic rhinitis due to animal (cat) (dog) hair and dander: Secondary | ICD-10-CM | POA: Diagnosis not present

## 2021-09-19 DIAGNOSIS — S46291D Other injury of muscle, fascia and tendon of other parts of biceps, right arm, subsequent encounter: Secondary | ICD-10-CM | POA: Diagnosis not present

## 2021-09-24 DIAGNOSIS — J301 Allergic rhinitis due to pollen: Secondary | ICD-10-CM | POA: Diagnosis not present

## 2021-09-24 DIAGNOSIS — J3089 Other allergic rhinitis: Secondary | ICD-10-CM | POA: Diagnosis not present

## 2021-09-24 DIAGNOSIS — J3081 Allergic rhinitis due to animal (cat) (dog) hair and dander: Secondary | ICD-10-CM | POA: Diagnosis not present

## 2021-10-01 DIAGNOSIS — J3081 Allergic rhinitis due to animal (cat) (dog) hair and dander: Secondary | ICD-10-CM | POA: Diagnosis not present

## 2021-10-01 DIAGNOSIS — J301 Allergic rhinitis due to pollen: Secondary | ICD-10-CM | POA: Diagnosis not present

## 2021-10-01 DIAGNOSIS — J3089 Other allergic rhinitis: Secondary | ICD-10-CM | POA: Diagnosis not present

## 2021-10-09 DIAGNOSIS — G8918 Other acute postprocedural pain: Secondary | ICD-10-CM | POA: Diagnosis not present

## 2021-10-09 DIAGNOSIS — S46291A Other injury of muscle, fascia and tendon of other parts of biceps, right arm, initial encounter: Secondary | ICD-10-CM | POA: Diagnosis not present

## 2021-10-09 DIAGNOSIS — M67813 Other specified disorders of tendon, right shoulder: Secondary | ICD-10-CM | POA: Diagnosis not present

## 2021-10-09 DIAGNOSIS — S46211A Strain of muscle, fascia and tendon of other parts of biceps, right arm, initial encounter: Secondary | ICD-10-CM | POA: Diagnosis not present

## 2021-10-24 DIAGNOSIS — M25622 Stiffness of left elbow, not elsewhere classified: Secondary | ICD-10-CM | POA: Diagnosis not present

## 2021-10-24 DIAGNOSIS — S46291D Other injury of muscle, fascia and tendon of other parts of biceps, right arm, subsequent encounter: Secondary | ICD-10-CM | POA: Diagnosis not present

## 2021-10-31 DIAGNOSIS — M25521 Pain in right elbow: Secondary | ICD-10-CM | POA: Diagnosis not present

## 2021-11-06 DIAGNOSIS — M25521 Pain in right elbow: Secondary | ICD-10-CM | POA: Diagnosis not present

## 2021-11-14 DIAGNOSIS — M25521 Pain in right elbow: Secondary | ICD-10-CM | POA: Diagnosis not present

## 2022-02-07 DIAGNOSIS — H5203 Hypermetropia, bilateral: Secondary | ICD-10-CM | POA: Diagnosis not present

## 2022-03-06 DIAGNOSIS — H524 Presbyopia: Secondary | ICD-10-CM | POA: Diagnosis not present

## 2022-04-03 DIAGNOSIS — B351 Tinea unguium: Secondary | ICD-10-CM | POA: Diagnosis not present

## 2022-04-03 DIAGNOSIS — I779 Disorder of arteries and arterioles, unspecified: Secondary | ICD-10-CM | POA: Diagnosis not present

## 2022-04-03 DIAGNOSIS — N182 Chronic kidney disease, stage 2 (mild): Secondary | ICD-10-CM | POA: Diagnosis not present

## 2022-04-03 DIAGNOSIS — E559 Vitamin D deficiency, unspecified: Secondary | ICD-10-CM | POA: Diagnosis not present

## 2022-04-03 DIAGNOSIS — D692 Other nonthrombocytopenic purpura: Secondary | ICD-10-CM | POA: Diagnosis not present

## 2022-04-03 DIAGNOSIS — I10 Essential (primary) hypertension: Secondary | ICD-10-CM | POA: Diagnosis not present

## 2022-04-03 DIAGNOSIS — Z79899 Other long term (current) drug therapy: Secondary | ICD-10-CM | POA: Diagnosis not present

## 2022-04-03 DIAGNOSIS — E7800 Pure hypercholesterolemia, unspecified: Secondary | ICD-10-CM | POA: Diagnosis not present

## 2022-04-03 DIAGNOSIS — Z0000 Encounter for general adult medical examination without abnormal findings: Secondary | ICD-10-CM | POA: Diagnosis not present

## 2022-04-03 DIAGNOSIS — E1122 Type 2 diabetes mellitus with diabetic chronic kidney disease: Secondary | ICD-10-CM | POA: Diagnosis not present

## 2022-04-03 DIAGNOSIS — J019 Acute sinusitis, unspecified: Secondary | ICD-10-CM | POA: Diagnosis not present

## 2022-05-15 DIAGNOSIS — J3081 Allergic rhinitis due to animal (cat) (dog) hair and dander: Secondary | ICD-10-CM | POA: Diagnosis not present

## 2022-05-15 DIAGNOSIS — H1045 Other chronic allergic conjunctivitis: Secondary | ICD-10-CM | POA: Diagnosis not present

## 2022-05-15 DIAGNOSIS — J301 Allergic rhinitis due to pollen: Secondary | ICD-10-CM | POA: Diagnosis not present

## 2022-05-15 DIAGNOSIS — J3089 Other allergic rhinitis: Secondary | ICD-10-CM | POA: Diagnosis not present

## 2022-11-20 DIAGNOSIS — E7800 Pure hypercholesterolemia, unspecified: Secondary | ICD-10-CM | POA: Diagnosis not present

## 2022-11-20 DIAGNOSIS — E559 Vitamin D deficiency, unspecified: Secondary | ICD-10-CM | POA: Diagnosis not present

## 2022-11-20 DIAGNOSIS — Z79899 Other long term (current) drug therapy: Secondary | ICD-10-CM | POA: Diagnosis not present

## 2022-11-20 DIAGNOSIS — M545 Low back pain, unspecified: Secondary | ICD-10-CM | POA: Diagnosis not present

## 2022-11-20 DIAGNOSIS — E1122 Type 2 diabetes mellitus with diabetic chronic kidney disease: Secondary | ICD-10-CM | POA: Diagnosis not present

## 2022-11-20 DIAGNOSIS — I779 Disorder of arteries and arterioles, unspecified: Secondary | ICD-10-CM | POA: Diagnosis not present

## 2023-01-16 DIAGNOSIS — M545 Low back pain, unspecified: Secondary | ICD-10-CM | POA: Diagnosis not present

## 2023-02-06 DIAGNOSIS — M4306 Spondylolysis, lumbar region: Secondary | ICD-10-CM | POA: Diagnosis not present

## 2023-02-13 DIAGNOSIS — M4306 Spondylolysis, lumbar region: Secondary | ICD-10-CM | POA: Diagnosis not present

## 2023-02-20 DIAGNOSIS — M4306 Spondylolysis, lumbar region: Secondary | ICD-10-CM | POA: Diagnosis not present

## 2023-02-27 DIAGNOSIS — M545 Low back pain, unspecified: Secondary | ICD-10-CM | POA: Diagnosis not present

## 2023-02-27 DIAGNOSIS — M4306 Spondylolysis, lumbar region: Secondary | ICD-10-CM | POA: Diagnosis not present
